# Patient Record
Sex: Female | Born: 1950
Health system: Southern US, Community
[De-identification: ages and names within clinical notes are randomized; demographics above are authoritative.]

## PROBLEM LIST (undated history)

## (undated) DIAGNOSIS — L4050 Arthropathic psoriasis, unspecified: Secondary | ICD-10-CM

## (undated) DIAGNOSIS — H8110 Benign paroxysmal vertigo, unspecified ear: Secondary | ICD-10-CM

## (undated) DIAGNOSIS — R42 Dizziness and giddiness: Secondary | ICD-10-CM

## (undated) DIAGNOSIS — Z86718 Personal history of other venous thrombosis and embolism: Secondary | ICD-10-CM

## (undated) DIAGNOSIS — K219 Gastro-esophageal reflux disease without esophagitis: Secondary | ICD-10-CM

## (undated) DIAGNOSIS — F1190 Opioid use, unspecified, uncomplicated: Secondary | ICD-10-CM

## (undated) DIAGNOSIS — Z79891 Long term (current) use of opiate analgesic: Secondary | ICD-10-CM

## (undated) DIAGNOSIS — E7800 Pure hypercholesterolemia, unspecified: Secondary | ICD-10-CM

## (undated) DIAGNOSIS — B1910 Unspecified viral hepatitis B without hepatic coma: Secondary | ICD-10-CM

## (undated) DIAGNOSIS — E119 Type 2 diabetes mellitus without complications: Secondary | ICD-10-CM

## (undated) DIAGNOSIS — B159 Hepatitis A without hepatic coma: Secondary | ICD-10-CM

## (undated) DIAGNOSIS — I10 Essential (primary) hypertension: Secondary | ICD-10-CM

## (undated) DIAGNOSIS — F329 Major depressive disorder, single episode, unspecified: Secondary | ICD-10-CM

## (undated) DIAGNOSIS — M5432 Sciatica, left side: Secondary | ICD-10-CM

## (undated) DIAGNOSIS — I499 Cardiac arrhythmia, unspecified: Secondary | ICD-10-CM

## (undated) HISTORY — PX: TUBAL LIGATION: SHX77

## (undated) HISTORY — PX: INDUCED ABORTION: SHX677

## (undated) HISTORY — PX: COLONOSCOPY: SHX174

## (undated) HISTORY — PX: DILATION AND CURETTAGE OF UTERUS: SHX78

---
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
## 2010-07-21 ENCOUNTER — Ambulatory Visit: Payer: Self-pay

## 2012-08-01 ENCOUNTER — Ambulatory Visit: Payer: Self-pay | Admitting: Internal Medicine

## 2012-11-06 ENCOUNTER — Ambulatory Visit: Payer: Self-pay | Admitting: Internal Medicine

## 2012-12-26 ENCOUNTER — Ambulatory Visit: Payer: Self-pay | Admitting: Neurosurgery

## 2013-06-24 ENCOUNTER — Ambulatory Visit: Payer: Self-pay | Admitting: Gastroenterology

## 2013-06-26 LAB — PATHOLOGY REPORT

## 2013-08-19 ENCOUNTER — Ambulatory Visit: Payer: Self-pay | Admitting: Nurse Practitioner

## 2014-06-12 DIAGNOSIS — Z86718 Personal history of other venous thrombosis and embolism: Secondary | ICD-10-CM

## 2014-06-12 HISTORY — DX: Personal history of other venous thrombosis and embolism: Z86.718

## 2014-09-17 ENCOUNTER — Ambulatory Visit
Admit: 2014-09-17 | Disposition: A | Discharge status: Home / Self Care | Payer: Self-pay | Attending: Nurse Practitioner | Admitting: Nurse Practitioner

## 2015-11-09 DIAGNOSIS — F329 Major depressive disorder, single episode, unspecified: Secondary | ICD-10-CM | POA: Insufficient documentation

## 2015-11-09 DIAGNOSIS — M545 Low back pain: Secondary | ICD-10-CM

## 2015-11-09 DIAGNOSIS — F32A Depression, unspecified: Secondary | ICD-10-CM

## 2015-11-09 DIAGNOSIS — L4050 Arthropathic psoriasis, unspecified: Secondary | ICD-10-CM | POA: Insufficient documentation

## 2015-11-09 DIAGNOSIS — L409 Psoriasis, unspecified: Secondary | ICD-10-CM | POA: Insufficient documentation

## 2015-11-09 DIAGNOSIS — G8929 Other chronic pain: Secondary | ICD-10-CM | POA: Insufficient documentation

## 2015-11-09 DIAGNOSIS — E669 Obesity, unspecified: Secondary | ICD-10-CM

## 2015-11-09 DIAGNOSIS — I10 Essential (primary) hypertension: Secondary | ICD-10-CM

## 2015-11-09 DIAGNOSIS — M7751 Other enthesopathy of right foot: Secondary | ICD-10-CM | POA: Insufficient documentation

## 2015-11-09 HISTORY — DX: Depression, unspecified: F32.A

## 2015-11-19 DIAGNOSIS — M47817 Spondylosis without myelopathy or radiculopathy, lumbosacral region: Secondary | ICD-10-CM | POA: Insufficient documentation

## 2015-11-19 DIAGNOSIS — M779 Enthesopathy, unspecified: Secondary | ICD-10-CM | POA: Insufficient documentation

## 2015-11-19 DIAGNOSIS — B180 Chronic viral hepatitis B with delta-agent: Secondary | ICD-10-CM

## 2015-11-19 DIAGNOSIS — R768 Other specified abnormal immunological findings in serum: Secondary | ICD-10-CM | POA: Insufficient documentation

## 2015-12-11 DIAGNOSIS — B1910 Unspecified viral hepatitis B without hepatic coma: Secondary | ICD-10-CM

## 2015-12-11 HISTORY — DX: Unspecified viral hepatitis B without hepatic coma: B19.10

## 2015-12-22 ENCOUNTER — Other Ambulatory Visit: Payer: Self-pay | Admitting: Student

## 2015-12-22 DIAGNOSIS — K760 Fatty (change of) liver, not elsewhere classified: Secondary | ICD-10-CM

## 2015-12-27 ENCOUNTER — Ambulatory Visit
Admission: RE | Admit: 2015-12-27 | Discharge: 2015-12-27 | Disposition: A | Discharge status: Home / Self Care | Payer: Medicare Other | Source: Ambulatory Visit | Attending: Student | Admitting: Student

## 2015-12-27 DIAGNOSIS — R768 Other specified abnormal immunological findings in serum: Secondary | ICD-10-CM | POA: Insufficient documentation

## 2015-12-27 DIAGNOSIS — Z8719 Personal history of other diseases of the digestive system: Secondary | ICD-10-CM | POA: Insufficient documentation

## 2015-12-27 DIAGNOSIS — K760 Fatty (change of) liver, not elsewhere classified: Secondary | ICD-10-CM | POA: Diagnosis not present

## 2016-04-03 ENCOUNTER — Other Ambulatory Visit: Payer: Self-pay | Admitting: Nurse Practitioner

## 2016-04-03 DIAGNOSIS — Z1231 Encounter for screening mammogram for malignant neoplasm of breast: Secondary | ICD-10-CM

## 2016-05-08 ENCOUNTER — Ambulatory Visit
Admission: RE | Admit: 2016-05-08 | Discharge: 2016-05-08 | Disposition: A | Discharge status: Home / Self Care | Payer: Medicare Other | Source: Ambulatory Visit | Attending: Nurse Practitioner | Admitting: Nurse Practitioner

## 2016-05-08 ENCOUNTER — Encounter (HOSPITAL_COMMUNITY): Payer: Self-pay

## 2016-05-08 DIAGNOSIS — Z1231 Encounter for screening mammogram for malignant neoplasm of breast: Secondary | ICD-10-CM | POA: Insufficient documentation

## 2016-08-28 DIAGNOSIS — R200 Anesthesia of skin: Secondary | ICD-10-CM

## 2016-08-28 DIAGNOSIS — G5600 Carpal tunnel syndrome, unspecified upper limb: Secondary | ICD-10-CM | POA: Insufficient documentation

## 2016-08-28 DIAGNOSIS — Z79899 Other long term (current) drug therapy: Secondary | ICD-10-CM

## 2016-08-28 DIAGNOSIS — R202 Paresthesia of skin: Secondary | ICD-10-CM

## 2016-08-29 ENCOUNTER — Other Ambulatory Visit: Payer: Self-pay

## 2016-08-29 ENCOUNTER — Telehealth: Payer: Self-pay

## 2016-08-29 DIAGNOSIS — Z86010 Personal history of colonic polyps: Secondary | ICD-10-CM

## 2016-08-29 NOTE — Telephone Encounter (Signed)
Gastroenterology Pre-Procedure Review  Request Date:  10/02/16 Requesting Physician: Dr. Allen Norris  PATIENT REVIEW QUESTIONS: The patient responded to the following health history questions as indicated:    1. Are you having any GI issues? no 2. Do you have a personal history of Polyps? yes (adenoma) 3. Do you have a family history of Colon Cancer or Polyps? no 4. Diabetes Mellitus? yes (Type II) 5. Joint replacements in the past 12 months?no 6. Major health problems in the past 3 months?no 7. Any artificial heart valves, MVP, or defibrillator?no    MEDICATIONS & ALLERGIES:    Patient reports the following regarding taking any anticoagulation/antiplatelet therapy:   Plavix, Coumadin, Eliquis, Xarelto, Lovenox, Pradaxa, Brilinta, or Effient? yes (Xarelto) Aspirin? No  Patient confirms/reports the following medications:  Current Outpatient Prescriptions  Medication Sig Dispense Refill  . amoxicillin-clavulanate (AUGMENTIN) 875-125 MG tablet     . Apremilast 30 MG TABS Take by mouth.    Marland Kitchen atorvastatin (LIPITOR) 20 MG tablet     . cyclobenzaprine (FLEXERIL) 10 MG tablet     . fenofibrate (TRICOR) 145 MG tablet Take by mouth.    . fluticasone (FLONASE) 50 MCG/ACT nasal spray Place into the nose.    . gabapentin (NEURONTIN) 300 MG capsule Take 1 tab 2 to 3 times a day as needed    . lisinopril-hydrochlorothiazide (PRINZIDE,ZESTORETIC) 10-12.5 MG tablet once a day    . metFORMIN (GLUCOPHAGE-XR) 500 MG 24 hr tablet     . pantoprazole (PROTONIX) 40 MG tablet     . rivaroxaban (XARELTO) 20 MG TABS tablet Take by mouth.    . sertraline (ZOLOFT) 100 MG tablet Take by mouth.    . traMADol (ULTRAM) 50 MG tablet 1 tablet 3 times a day for 10 days as needed for back pain    . fluocinonide cream (LIDEX) 0.05 % Apply topically.     No current facility-administered medications for this visit.     Patient confirms/reports the following allergies:  Allergies not on file  No orders of the defined  types were placed in this encounter.   AUTHORIZATION INFORMATION Primary Insurance: 1D#: Group #:  Secondary Insurance: 1D#: Group #:  SCHEDULE INFORMATION: Date: 10/02/16 Time: Location: [place]

## 2016-09-12 IMAGING — US US ABDOMEN COMPLETE
1 series · 14 of 25 positions shown · non-contrast
Comparison: None.

CLINICAL DATA: Fatty liver.  Abnormal LFTs.

EXAM:
ABDOMEN ULTRASOUND COMPLETE

[Series 1: us abdomen complete · 0.17mm/px · 14 of 94 slices shown]
[im 1/94]
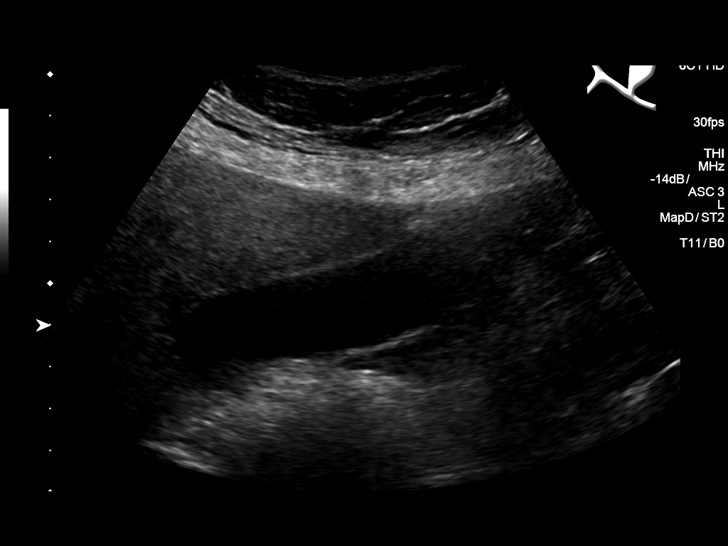
[im 8/94]
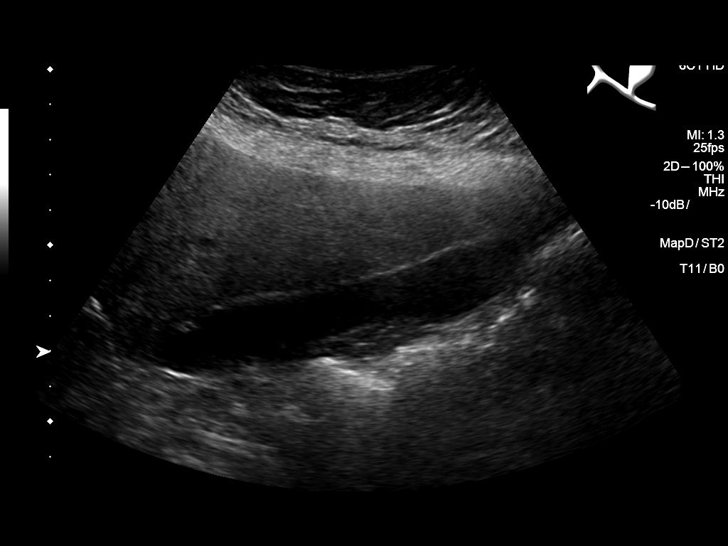
[im 16/94]
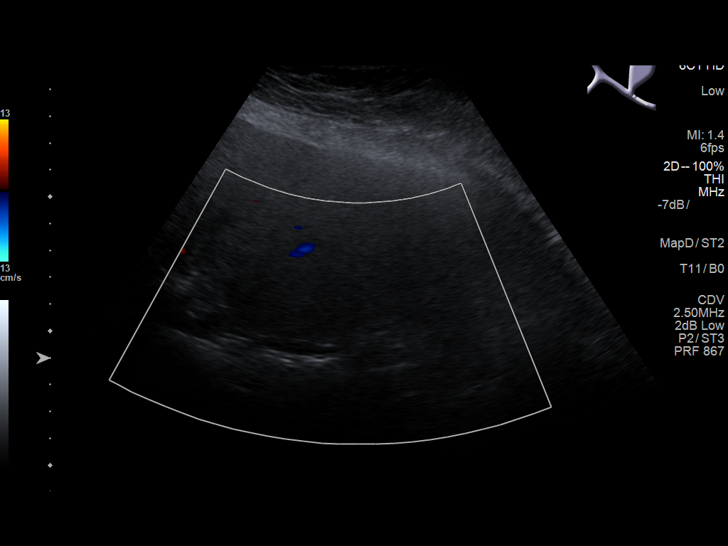
[im 24/94]
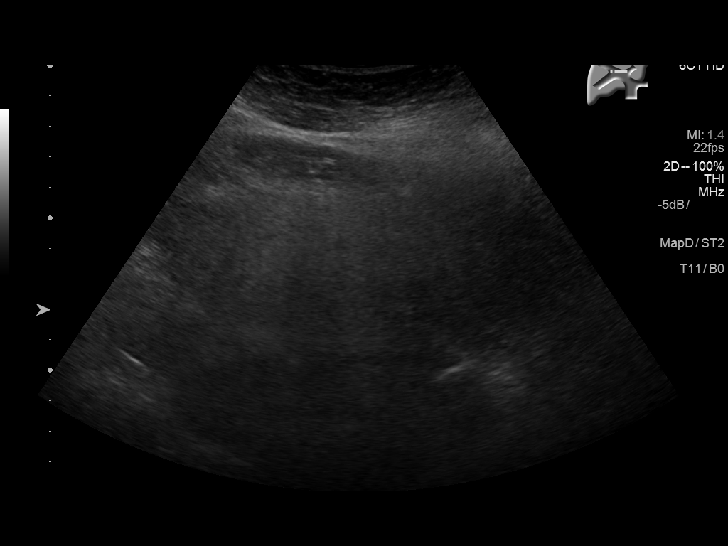
[im 32/94]
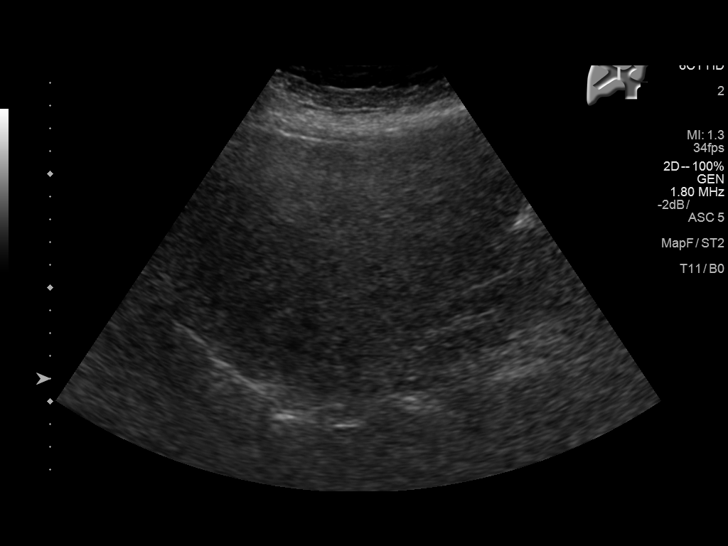
[im 35/94]
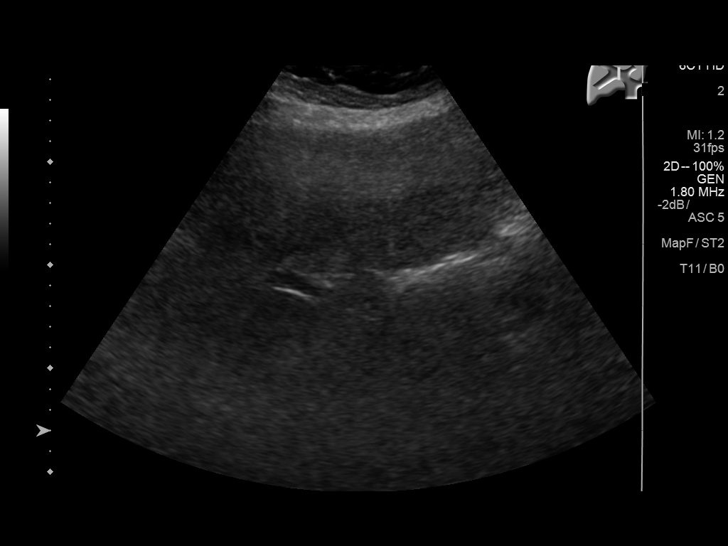
[im 43/94]
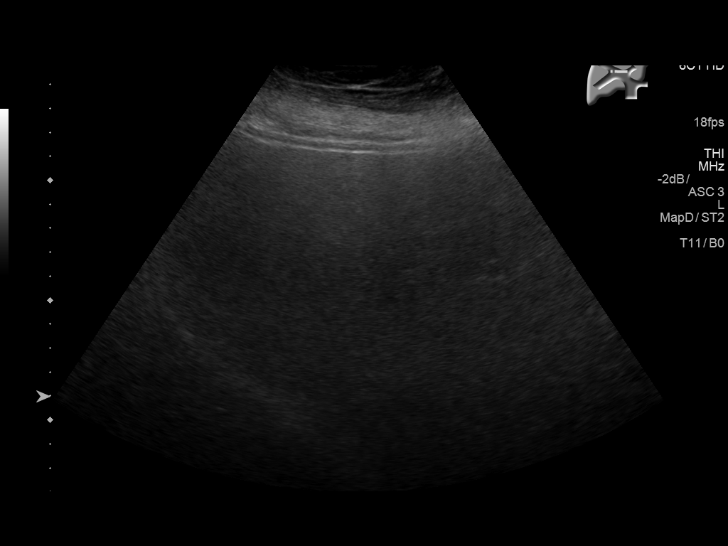
[im 51/94]
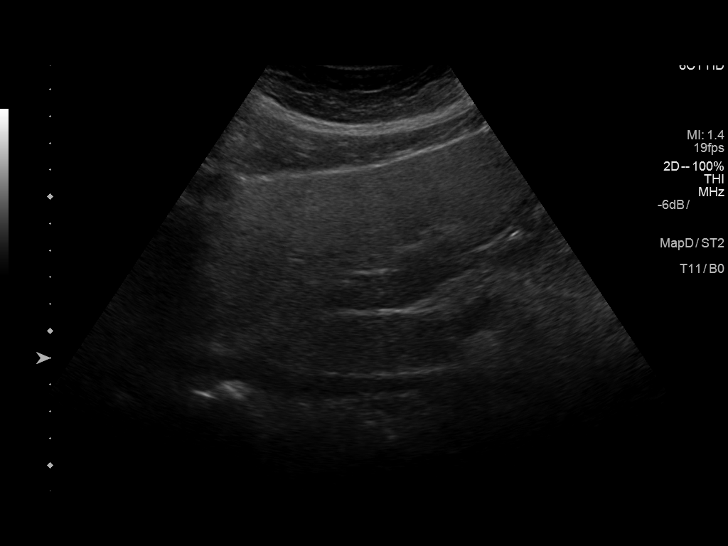
[im 59/94]
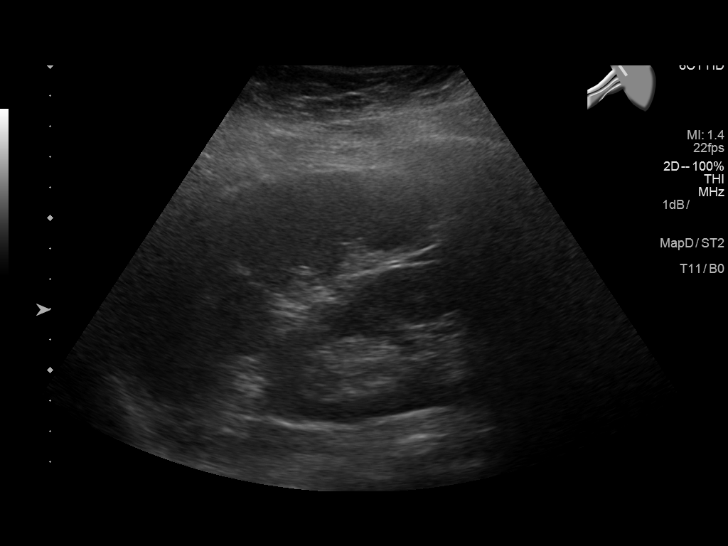
[im 63/94]
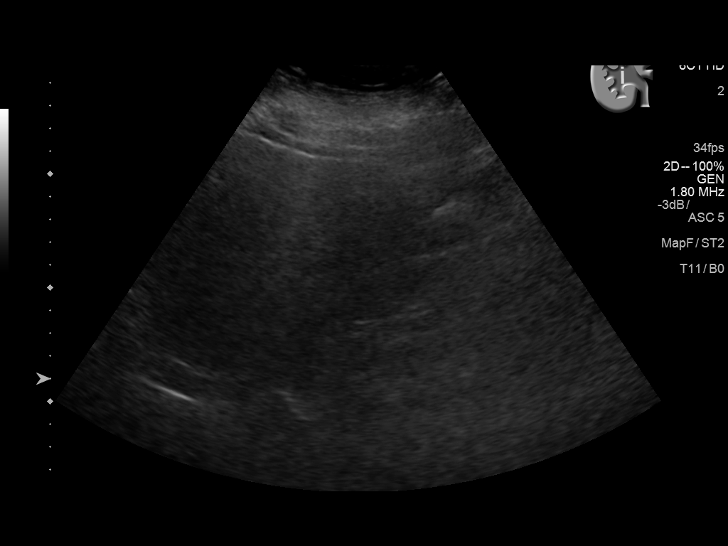
[im 70/94]
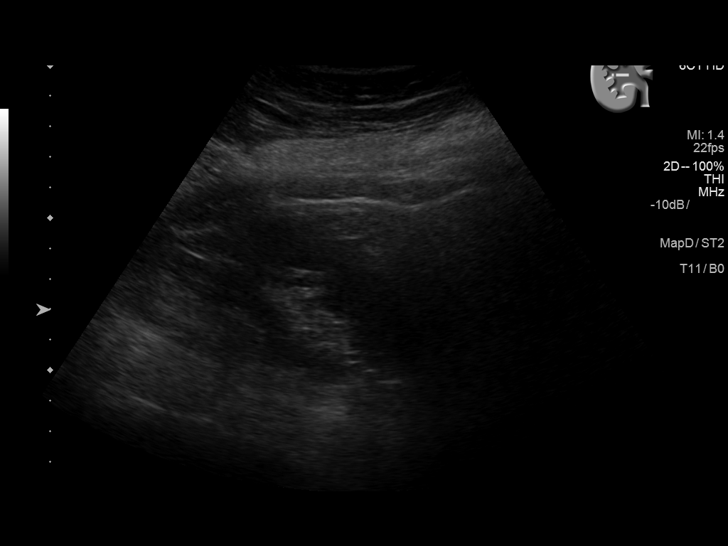
[im 78/94]
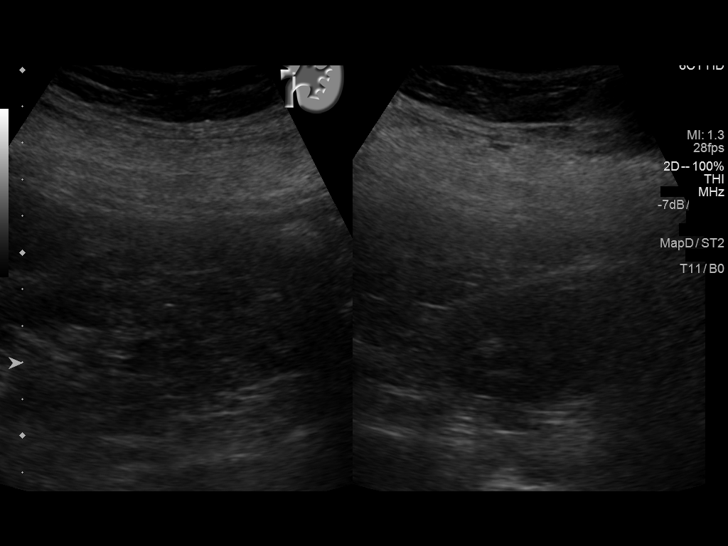
[im 86/94]
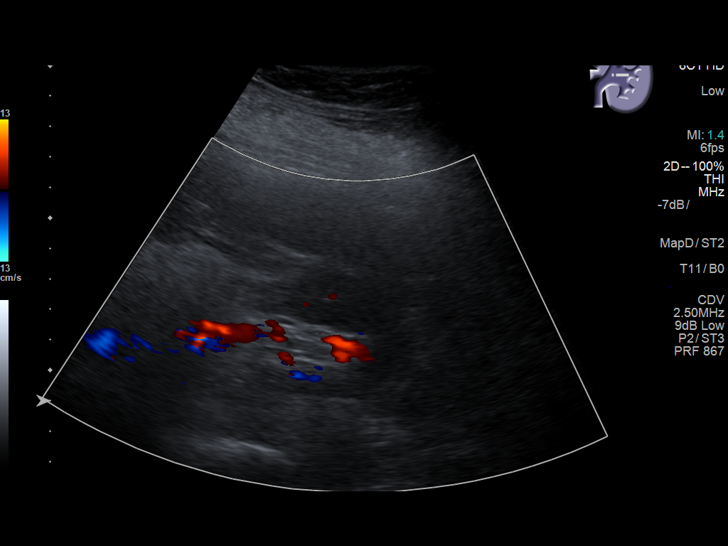
[im 94/94]
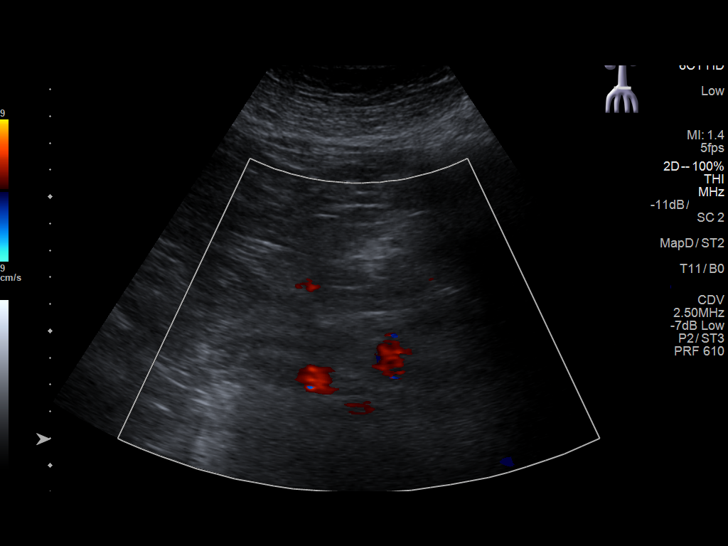

[14 of 25 positions shown; findings below may reference images not displayed]

FINDINGS: Gallbladder: No gallstones or wall thickening visualized. No
sonographic Murphy sign noted by sonographer.

Common bile duct: Diameter: 6.1 mm

Liver: No focal abnormality identified. The liver is echodense
consistent fatty infiltration and/or hepatocellular disease.

IVC: No abnormality visualized.

Pancreas: Visualized portion unremarkable.

Spleen: Size and appearance within normal limits.

Right Kidney: Length: 12.1 cm. Echogenicity within normal limits. No
mass or hydronephrosis visualized.

Left Kidney: Length: 12.2 cm. Echogenicity within normal limits. No
hydronephrosis visualized. 1.1 cm simple cyst

Abdominal aorta: No aneurysm visualized.

Other findings: None.
IMPRESSION: 1. Liver is echodense consistent fatty infiltration and/or
hepatocellular disease.

2. No acute or focal abnormality.

## 2016-10-03 ENCOUNTER — Encounter: Payer: Self-pay | Admitting: *Deleted

## 2016-10-06 NOTE — Discharge Instructions (Signed)

## 2016-10-09 ENCOUNTER — Ambulatory Visit: Payer: Medicare Other | Admitting: Anesthesiology

## 2016-10-09 ENCOUNTER — Encounter
Admission: RE | Disposition: A | Discharge status: Home / Self Care | Payer: Self-pay | Source: Ambulatory Visit | Attending: Gastroenterology

## 2016-10-09 ENCOUNTER — Ambulatory Visit
Admission: RE | Admit: 2016-10-09 | Discharge: 2016-10-09 | Disposition: A | Discharge status: Home / Self Care | Payer: Medicare Other | Source: Ambulatory Visit | Attending: Gastroenterology | Admitting: Gastroenterology

## 2016-10-09 DIAGNOSIS — Z79899 Other long term (current) drug therapy: Secondary | ICD-10-CM | POA: Diagnosis not present

## 2016-10-09 DIAGNOSIS — Z7984 Long term (current) use of oral hypoglycemic drugs: Secondary | ICD-10-CM | POA: Diagnosis not present

## 2016-10-09 DIAGNOSIS — K5730 Diverticulosis of large intestine without perforation or abscess without bleeding: Secondary | ICD-10-CM | POA: Insufficient documentation

## 2016-10-09 DIAGNOSIS — I10 Essential (primary) hypertension: Secondary | ICD-10-CM | POA: Diagnosis not present

## 2016-10-09 DIAGNOSIS — F329 Major depressive disorder, single episode, unspecified: Secondary | ICD-10-CM | POA: Diagnosis not present

## 2016-10-09 DIAGNOSIS — Z7901 Long term (current) use of anticoagulants: Secondary | ICD-10-CM | POA: Insufficient documentation

## 2016-10-09 DIAGNOSIS — Z1211 Encounter for screening for malignant neoplasm of colon: Secondary | ICD-10-CM | POA: Diagnosis present

## 2016-10-09 DIAGNOSIS — L4050 Arthropathic psoriasis, unspecified: Secondary | ICD-10-CM | POA: Diagnosis not present

## 2016-10-09 DIAGNOSIS — Z86718 Personal history of other venous thrombosis and embolism: Secondary | ICD-10-CM | POA: Insufficient documentation

## 2016-10-09 DIAGNOSIS — Z860100 Personal history of colon polyps, unspecified: Secondary | ICD-10-CM

## 2016-10-09 DIAGNOSIS — D125 Benign neoplasm of sigmoid colon: Secondary | ICD-10-CM

## 2016-10-09 DIAGNOSIS — E7800 Pure hypercholesterolemia, unspecified: Secondary | ICD-10-CM | POA: Diagnosis not present

## 2016-10-09 DIAGNOSIS — E119 Type 2 diabetes mellitus without complications: Secondary | ICD-10-CM | POA: Diagnosis not present

## 2016-10-09 DIAGNOSIS — K640 First degree hemorrhoids: Secondary | ICD-10-CM | POA: Insufficient documentation

## 2016-10-09 DIAGNOSIS — K219 Gastro-esophageal reflux disease without esophagitis: Secondary | ICD-10-CM | POA: Insufficient documentation

## 2016-10-09 DIAGNOSIS — Z91013 Allergy to seafood: Secondary | ICD-10-CM | POA: Insufficient documentation

## 2016-10-09 DIAGNOSIS — D126 Benign neoplasm of colon, unspecified: Secondary | ICD-10-CM

## 2016-10-09 DIAGNOSIS — R000 Tachycardia, unspecified: Secondary | ICD-10-CM | POA: Diagnosis not present

## 2016-10-09 DIAGNOSIS — Z87891 Personal history of nicotine dependence: Secondary | ICD-10-CM | POA: Diagnosis not present

## 2016-10-09 DIAGNOSIS — Z6841 Body Mass Index (BMI) 40.0 and over, adult: Secondary | ICD-10-CM | POA: Diagnosis not present

## 2016-10-09 DIAGNOSIS — Z885 Allergy status to narcotic agent status: Secondary | ICD-10-CM | POA: Diagnosis not present

## 2016-10-09 HISTORY — DX: Unspecified viral hepatitis B without hepatic coma: B19.10

## 2016-10-09 HISTORY — DX: Arthropathic psoriasis, unspecified: L40.50

## 2016-10-09 HISTORY — DX: Essential (primary) hypertension: I10

## 2016-10-09 HISTORY — DX: Personal history of other venous thrombosis and embolism: Z86.718

## 2016-10-09 HISTORY — DX: Sciatica, left side: M54.32

## 2016-10-09 HISTORY — DX: Cardiac arrhythmia, unspecified: I49.9

## 2016-10-09 HISTORY — DX: Dizziness and giddiness: R42

## 2016-10-09 HISTORY — DX: Pure hypercholesterolemia, unspecified: E78.00

## 2016-10-09 HISTORY — DX: Major depressive disorder, single episode, unspecified: F32.9

## 2016-10-09 HISTORY — PX: POLYPECTOMY: SHX5525

## 2016-10-09 HISTORY — DX: Hepatitis a without hepatic coma: B15.9

## 2016-10-09 HISTORY — PX: COLONOSCOPY WITH PROPOFOL: SHX5780

## 2016-10-09 HISTORY — DX: Type 2 diabetes mellitus without complications: E11.9

## 2016-10-09 HISTORY — DX: Gastro-esophageal reflux disease without esophagitis: K21.9

## 2016-10-09 LAB — GLUCOSE, CAPILLARY
GLUCOSE-CAPILLARY: 109 mg/dL — AB (ref 65–99)
GLUCOSE-CAPILLARY: 114 mg/dL — AB (ref 65–99)

## 2016-10-09 SURGERY — COLONOSCOPY WITH PROPOFOL
Anesthesia: Monitor Anesthesia Care | Wound class: Contaminated

## 2016-10-09 MED ORDER — STERILE WATER FOR IRRIGATION IR SOLN
Status: DC | PRN
  Administered 2016-10-09: 08:00:00

## 2016-10-09 MED ORDER — OXYCODONE HCL 5 MG/5ML PO SOLN: 5.0000 mg | Freq: Once | ORAL | Status: DC | PRN

## 2016-10-09 MED ORDER — FENTANYL CITRATE (PF) 100 MCG/2ML IJ SOLN: 25.0000 ug | INTRAMUSCULAR | Status: DC | PRN

## 2016-10-09 MED ORDER — LIDOCAINE HCL (CARDIAC) 20 MG/ML IV SOLN
INTRAVENOUS | Status: DC | PRN
  Administered 2016-10-09: 50 mg via INTRAVENOUS

## 2016-10-09 MED ORDER — LACTATED RINGERS IV SOLN
INTRAVENOUS | Status: DC | PRN
  Administered 2016-10-09: 08:00:00 via INTRAVENOUS

## 2016-10-09 MED ORDER — OXYCODONE HCL 5 MG PO TABS: 5.0000 mg | ORAL_TABLET | Freq: Once | ORAL | Status: DC | PRN

## 2016-10-09 MED ORDER — PROPOFOL 10 MG/ML IV BOLUS
INTRAVENOUS | Status: DC | PRN
  Administered 2016-10-09 (×2): 50 mg via INTRAVENOUS
  Administered 2016-10-09: 100 mg via INTRAVENOUS
  Administered 2016-10-09 (×2): 50 mg via INTRAVENOUS

## 2016-10-09 SURGICAL SUPPLY — 23 items

## 2016-10-09 NOTE — Anesthesia Procedure Notes (Signed)
Procedure Name: MAC Date/Time: 10/09/2016 7:48 AM Performed by: Janna Arch Pre-anesthesia Checklist: Patient identified, Emergency Drugs available, Suction available and Patient being monitored Patient Re-evaluated:Patient Re-evaluated prior to inductionOxygen Delivery Method: Nasal cannula

## 2016-10-09 NOTE — Anesthesia Postprocedure Evaluation (Signed)
Anesthesia Post Note  Patient: Diane Morris  Procedure(s) Performed: Procedure(s) (LRB): COLONOSCOPY WITH PROPOFOL (N/A) POLYPECTOMY  Patient location during evaluation: PACU Anesthesia Type: MAC Level of consciousness: awake Pain management: pain level controlled Vital Signs Assessment: post-procedure vital signs reviewed and stable Respiratory status: spontaneous breathing Cardiovascular status: blood pressure returned to baseline Postop Assessment: no headache Anesthetic complications: no    Lavonna Monarch

## 2016-10-09 NOTE — Transfer of Care (Signed)
Immediate Anesthesia Transfer of Care Note  Patient: Diane Morris  Procedure(s) Performed: Procedure(s) with comments: COLONOSCOPY WITH PROPOFOL (N/A) - Diabetic - oral meds POLYPECTOMY  Patient Location: PACU  Anesthesia Type: MAC  Level of Consciousness: awake, alert  and patient cooperative  Airway and Oxygen Therapy: Patient Spontanous Breathing and Patient connected to supplemental oxygen  Post-op Assessment: Post-op Vital signs reviewed, Patient's Cardiovascular Status Stable, Respiratory Function Stable, Patent Airway and No signs of Nausea or vomiting  Post-op Vital Signs: Reviewed and stable  Complications: No apparent anesthesia complications

## 2016-10-09 NOTE — Op Note (Signed)
[facility] Patient Name: Diane Morris Procedure Date: 10/09/2016 7:40 AM MRN: [id number] Account #: [id number] Date of Birth: 1951-05-08 Admit Type: Outpatient Age: 66 Room: [facility] OR ROOM 01 Gender: Female Note Status: Finalized Procedure:            Colonoscopy Indications:          High risk colon cancer surveillance: Personal history                        of colonic polyps Providers:            [name] MD, MD Referring MD:         Perrin Maltese, MD (Referring MD) Medicines:            Propofol per Anesthesia Complications:        No immediate complications. Procedure:            Pre-Anesthesia Assessment:                       - Prior to the procedure, a History and Physical was                        performed, and patient medications and allergies were                        reviewed. The patient's tolerance of previous                        anesthesia was also reviewed. The risks and benefits of                        the procedure and the sedation options and risks were                        discussed with the patient. All questions were                        answered, and informed consent was obtained. Prior                        Anticoagulants: The patient has taken no previous                        anticoagulant or antiplatelet agents. ASA Grade                        Assessment: II - A patient with mild systemic disease.                        After reviewing the risks and benefits, the patient was                        deemed in satisfactory condition to undergo the                        procedure.                       After obtaining informed consent, the colonoscope was  passed under direct vision. Throughout the procedure,                        the patient's blood pressure, pulse, and oxygen                        saturations were monitored continuously. The [place] [phone]) was introduced through the                        anus and advanced to the the cecum, identified by                        appendiceal orifice and ileocecal valve. The                        colonoscopy was performed without difficulty. The                        patient tolerated the procedure well. The quality of                        the bowel preparation was excellent. Findings:      The perianal and digital rectal examinations were normal.      A 3 mm polyp was found in the sigmoid colon. The polyp was sessile. The       polyp was removed with a cold biopsy forceps. Resection and retrieval       were complete.      A single small-mouthed diverticulum was found in the sigmoid colon.      Non-bleeding internal hemorrhoids were found during retroflexion. The       hemorrhoids were Grade I (internal hemorrhoids that do not prolapse). Impression:           - One 3 mm polyp in the sigmoid colon, removed with a                        cold biopsy forceps. Resected and retrieved.                       - Diverticulosis in the sigmoid colon.                       - Non-bleeding internal hemorrhoids. Recommendation:       - Discharge patient to home.                       - Resume previous diet.                       - Continue present medications. Procedure Code(s):    --- Professional ---                       [phone], Colonoscopy, flexible; with biopsy, single or                        multiple Diagnosis Code(s):    --- Professional ---  Z86.010, Personal history of colonic polyps                       D12.5, Benign neoplasm of sigmoid colon CPT copyright 2016 American [employer] Association. All rights reserved. The codes documented in this report are preliminary and upon coder review may  be revised to meet current compliance requirements. [name] MD, MD 10/09/2016 8:12:21 AM This report has been signed electronically. Number of Addenda:  0 Note Initiated On: 10/09/2016 7:40 AM Scope Withdrawal Time: 0 hours 7 minutes 55 seconds  Total Procedure Duration: 0 hours 11 minutes 8 seconds       [facility]

## 2016-10-09 NOTE — H&P (Signed)
[name], MD [address] [place], [place] [address] Phone:[phone] Fax : [phone]  Primary Care Physician:  Perrin Maltese, MD Primary Gastroenterologist:  Dr. Allen Norris  Pre-Procedure History & Physical: HPI:  Diane Morris is a 65 y.o. female is here for an colonoscopy.   Past Medical History:  Diagnosis Date  . Depression   . Diabetes mellitus type 2, noninsulin dependent ([place])   . Dysrhythmia    tachycardia - controlled with metoprolol  . GERD (gastroesophageal reflux disease)   . Hepatitis A    as teenager. treated  . Hepatitis B 12/2015   positive test  . Hx of blood clots 2016   left calf  . Hypercholesteremia   . Hypertension   . Psoriatic arthritis ([place])   . Sciatica of left side   . Vertigo    when anxious    Past Surgical History:  Procedure Laterality Date  . COLONOSCOPY    . DILATION AND CURETTAGE OF UTERUS    . INDUCED ABORTION    . TUBAL LIGATION      Prior to Admission medications   Medication Sig Start Date End Date Taking? Authorizing Provider  albuterol (PROVENTIL HFA;VENTOLIN HFA) 108 (90 Base) MCG/ACT inhaler Inhale into the lungs every 6 (six) hours as needed for wheezing or shortness of breath.   Yes Historical Provider, MD  Apremilast 30 MG TABS Take by mouth. 03/29/16  Yes Historical Provider, MD  atorvastatin (LIPITOR) 20 MG tablet  03/14/16  Yes Historical Provider, MD  cyclobenzaprine (FLEXERIL) 10 MG tablet  08/15/15  Yes Historical Provider, MD  gabapentin (NEURONTIN) 300 MG capsule Take 1 tab 2 to 3 times a day as needed 11/19/15  Yes Historical Provider, MD  lisinopril-hydrochlorothiazide (PRINZIDE,ZESTORETIC) 10-12.5 MG tablet once a day 02/10/15  Yes Historical Provider, MD  Loratadine 10 MG CAPS Take by mouth daily.   Yes Historical Provider, MD  metFORMIN (GLUCOPHAGE-XR) 500 MG 24 hr tablet  08/15/15  Yes Historical Provider, MD  metoprolol succinate (TOPROL-XL) 100 MG 24 hr tablet Take 100 mg by mouth daily.  Take with or immediately following a meal.   Yes Historical Provider, MD  pantoprazole (PROTONIX) 40 MG tablet  08/15/15  Yes Historical Provider, MD  rivaroxaban (XARELTO) 20 MG TABS tablet Take by mouth. 08/17/15  Yes Historical Provider, MD  sertraline (ZOLOFT) 100 MG tablet Take by mouth. 08/15/15  Yes Historical Provider, MD  traMADol (ULTRAM) 50 MG tablet 1 tablet 3 times a day for 10 days as needed for back pain 12/30/15  Yes Historical Provider, MD  fluocinonide cream (LIDEX) 0.05 % Apply topically.    Historical Provider, MD    Allergies as of 08/29/2016 - never reviewed  Allergen Reaction Noted  . Codeine  09/19/2012  . Other Other (See Comments) 12/22/2015  . Shellfish allergy Swelling 09/19/2012    Family History  Problem Relation Age of Onset  . Breast cancer Neg Hx     Social History   Social History  . Marital status: Widowed    Spouse name: N/A  . Number of children: N/A  . Years of education: N/A   Occupational History  . Not on file.   Social History Main Topics  . Smoking status: Former Smoker    Quit date: 06/12/2006  . Smokeless tobacco: Never Used  . Alcohol use No  . Drug use: Unknown  . Sexual activity: Not on file   Other Topics Concern  . Not on file   Social  History Narrative  . No narrative on file    Review of Systems: See HPI, otherwise negative ROS  Physical Exam: BP 101/68   Pulse 87   Temp 97.5 F (36.4 C) (Oral)   Resp 18   Ht 5\' 7"  (1.702 m)   Wt 256 lb (116.1 kg)   SpO2 99%   BMI 40.10 kg/m  General:   Alert,  pleasant and cooperative in NAD Head:  Normocephalic and atraumatic. Neck:  Supple; no masses or thyromegaly. Lungs:  Clear throughout to auscultation.    Heart:  Regular rate and rhythm. Abdomen:  Soft, nontender and nondistended. Normal bowel sounds, without guarding, and without rebound.   Neurologic:  Alert and  oriented x4;  grossly normal neurologically.  Impression/Plan: Diane Morris is here for an  colonoscopy to be performed for history of colon polyps  Risks, benefits, limitations, and alternatives regarding  colonoscopy have been reviewed with the patient.  Questions have been answered.  All parties agreeable.   [name], MD  10/09/2016, 7:27 AM

## 2016-10-09 NOTE — Anesthesia Preprocedure Evaluation (Signed)
Anesthesia Evaluation  Patient identified by MRN, date of birth, ID band Patient awake    Airway Mallampati: I  TM Distance: >3 FB Neck ROM: Full    Dental no notable dental hx.    Pulmonary neg pulmonary ROS, former smoker,    Pulmonary exam normal breath sounds clear to auscultation       Cardiovascular hypertension, Normal cardiovascular exam+ dysrhythmias (Sinus tachycardia, on metop)  Rhythm:Regular Rate:Normal     Neuro/Psych Depression  Neuromuscular disease    GI/Hepatic Neg liver ROS, GERD  ,  Endo/Other  diabetes, Type 2Morbid obesity  Renal/GU negative Renal ROS  negative genitourinary   Musculoskeletal Psoriatic arthritis   Abdominal Normal abdominal exam  (+) + obese,  Abdomen: soft.    Peds  Hematology negative hematology ROS (+)   Anesthesia Other Findings   Reproductive/Obstetrics negative OB ROS                             Anesthesia Physical Anesthesia Plan  ASA: II  Anesthesia Plan: MAC   Post-op Pain Management:    Induction: Intravenous  Airway Management Planned: Mask  Additional Equipment: None  Intra-op Plan:   Post-operative Plan:   Informed Consent: I have reviewed the patients History and Physical, chart, labs and discussed the procedure including the risks, benefits and alternatives for the proposed anesthesia with the patient or authorized representative who has indicated his/her understanding and acceptance.     Plan Discussed with:   Anesthesia Plan Comments:         Anesthesia Quick Evaluation

## 2016-10-10 ENCOUNTER — Encounter: Payer: Self-pay | Admitting: Gastroenterology

## 2016-10-11 ENCOUNTER — Encounter: Payer: Self-pay | Admitting: Gastroenterology

## 2016-10-12 ENCOUNTER — Encounter: Payer: Self-pay | Admitting: Gastroenterology

## 2016-12-28 DIAGNOSIS — L0231 Cutaneous abscess of buttock: Secondary | ICD-10-CM | POA: Insufficient documentation

## 2017-01-16 ENCOUNTER — Ambulatory Visit: Payer: Medicare Other | Admitting: Nurse Practitioner

## 2017-01-18 ENCOUNTER — Encounter: Payer: Self-pay | Admitting: Gastroenterology

## 2017-02-14 ENCOUNTER — Encounter: Payer: Self-pay | Admitting: Nurse Practitioner

## 2017-02-14 ENCOUNTER — Ambulatory Visit: Payer: Medicare Other | Attending: Nurse Practitioner | Admitting: Nurse Practitioner

## 2017-02-14 VITALS — BP 113/52 | HR 93 | Temp 98.0°F | Resp 16 | Ht 67.0 in | Wt 260.0 lb

## 2017-02-14 DIAGNOSIS — Z9889 Other specified postprocedural states: Secondary | ICD-10-CM | POA: Diagnosis not present

## 2017-02-14 DIAGNOSIS — M542 Cervicalgia: Secondary | ICD-10-CM

## 2017-02-14 DIAGNOSIS — F1190 Opioid use, unspecified, uncomplicated: Secondary | ICD-10-CM

## 2017-02-14 DIAGNOSIS — D125 Benign neoplasm of sigmoid colon: Secondary | ICD-10-CM | POA: Diagnosis not present

## 2017-02-14 DIAGNOSIS — M79641 Pain in right hand: Secondary | ICD-10-CM | POA: Insufficient documentation

## 2017-02-14 DIAGNOSIS — M25511 Pain in right shoulder: Secondary | ICD-10-CM | POA: Diagnosis not present

## 2017-02-14 DIAGNOSIS — M25551 Pain in right hip: Secondary | ICD-10-CM | POA: Diagnosis not present

## 2017-02-14 DIAGNOSIS — M79642 Pain in left hand: Secondary | ICD-10-CM | POA: Diagnosis not present

## 2017-02-14 DIAGNOSIS — Z86010 Personal history of colonic polyps: Secondary | ICD-10-CM | POA: Diagnosis not present

## 2017-02-14 DIAGNOSIS — K219 Gastro-esophageal reflux disease without esophagitis: Secondary | ICD-10-CM | POA: Diagnosis not present

## 2017-02-14 DIAGNOSIS — M533 Sacrococcygeal disorders, not elsewhere classified: Secondary | ICD-10-CM

## 2017-02-14 DIAGNOSIS — Z91030 Bee allergy status: Secondary | ICD-10-CM | POA: Insufficient documentation

## 2017-02-14 DIAGNOSIS — Z885 Allergy status to narcotic agent status: Secondary | ICD-10-CM | POA: Insufficient documentation

## 2017-02-14 DIAGNOSIS — M79603 Pain in arm, unspecified: Secondary | ICD-10-CM

## 2017-02-14 DIAGNOSIS — Z91013 Allergy to seafood: Secondary | ICD-10-CM | POA: Diagnosis not present

## 2017-02-14 DIAGNOSIS — M79602 Pain in left arm: Secondary | ICD-10-CM | POA: Diagnosis not present

## 2017-02-14 DIAGNOSIS — E7800 Pure hypercholesterolemia, unspecified: Secondary | ICD-10-CM | POA: Insufficient documentation

## 2017-02-14 DIAGNOSIS — M25559 Pain in unspecified hip: Secondary | ICD-10-CM

## 2017-02-14 DIAGNOSIS — M79601 Pain in right arm: Secondary | ICD-10-CM | POA: Insufficient documentation

## 2017-02-14 DIAGNOSIS — M545 Low back pain, unspecified: Secondary | ICD-10-CM | POA: Insufficient documentation

## 2017-02-14 DIAGNOSIS — M25562 Pain in left knee: Secondary | ICD-10-CM | POA: Diagnosis not present

## 2017-02-14 DIAGNOSIS — Z79899 Other long term (current) drug therapy: Secondary | ICD-10-CM | POA: Diagnosis not present

## 2017-02-14 DIAGNOSIS — E1122 Type 2 diabetes mellitus with diabetic chronic kidney disease: Secondary | ICD-10-CM | POA: Diagnosis not present

## 2017-02-14 DIAGNOSIS — M25552 Pain in left hip: Secondary | ICD-10-CM | POA: Diagnosis not present

## 2017-02-14 DIAGNOSIS — M25561 Pain in right knee: Secondary | ICD-10-CM

## 2017-02-14 DIAGNOSIS — G8929 Other chronic pain: Secondary | ICD-10-CM | POA: Diagnosis not present

## 2017-02-14 DIAGNOSIS — Z79891 Long term (current) use of opiate analgesic: Secondary | ICD-10-CM | POA: Insufficient documentation

## 2017-02-14 DIAGNOSIS — Z1321 Encounter for screening for nutritional disorder: Secondary | ICD-10-CM

## 2017-02-14 DIAGNOSIS — Z91041 Radiographic dye allergy status: Secondary | ICD-10-CM | POA: Diagnosis not present

## 2017-02-14 DIAGNOSIS — Z87891 Personal history of nicotine dependence: Secondary | ICD-10-CM | POA: Diagnosis not present

## 2017-02-14 DIAGNOSIS — I129 Hypertensive chronic kidney disease with stage 1 through stage 4 chronic kidney disease, or unspecified chronic kidney disease: Secondary | ICD-10-CM | POA: Diagnosis not present

## 2017-02-14 DIAGNOSIS — Z86718 Personal history of other venous thrombosis and embolism: Secondary | ICD-10-CM | POA: Diagnosis not present

## 2017-02-14 DIAGNOSIS — F329 Major depressive disorder, single episode, unspecified: Secondary | ICD-10-CM | POA: Diagnosis not present

## 2017-02-14 DIAGNOSIS — Z7901 Long term (current) use of anticoagulants: Secondary | ICD-10-CM | POA: Diagnosis not present

## 2017-02-14 HISTORY — DX: Opioid use, unspecified, uncomplicated: F11.90

## 2017-02-14 HISTORY — DX: Long term (current) use of opiate analgesic: Z79.891

## 2017-02-14 NOTE — Patient Instructions (Addendum)
____________________________________________________________________________________________  Appointment Policy Summary  It is our goal and responsibility to provide the medical community with assistance in the evaluation and management of patients with chronic pain. Unfortunately our resources are limited. Because we do not have an unlimited amount of time, or available appointments, we are required to closely monitor and manage their use. The following rules exist to maximize their use:  Patient's responsibilities: 1. Punctuality:  At what time should I arrive? You should be physically present in our office 30 minutes before your scheduled appointment. Your scheduled appointment is with your assigned healthcare provider. However, it takes 5-10 minutes to be "checked-in", and another 15 minutes for the nurses to do the admission. If you arrive to our office at the time you were given for your appointment, you will end up being at least 20-25 minutes late to your appointment with the provider. 2. Tardiness:  What happens if I arrive only a few minutes after my scheduled appointment time? You will need to reschedule your appointment. The cutoff is your appointment time. This is why it is so important that you arrive at least 30 minutes before that appointment. If you have an appointment scheduled for 10:00 AM and you arrive at 10:01, you will be required to reschedule your appointment.  3. Plan ahead:  Always assume that you will encounter traffic on your way in. Plan for it. If you are dependent on a driver, make sure they understand these rules and the need to arrive early. 4. Other appointments and responsibilities:  Avoid scheduling any other appointments before or after your pain clinic appointments.  5. Be prepared:  Write down everything that you need to discuss with your healthcare provider and give this information to the admitting nurse. Write down the medications that you will need  refilled. Bring your pills and bottles (even the empty ones), to all of your appointments, except for those where a procedure is scheduled. 6. No children or pets:  Find someone to take care of them. It is not appropriate to bring them in. 7. Scheduling changes:  We request "advanced notification" of any changes or cancellations. 8. Advanced notification:  Defined as a time period of more than 24 hours prior to the originally scheduled appointment. This allows for the appointment to be offered to other patients. 9. Rescheduling:  When a visit is rescheduled, it will require the cancellation of the original appointment. For this reason they both fall within the category of "Cancellations".  10. Cancellations:  They require advanced notification. Any cancellation less than 24 hours before the  appointment will be recorded as a "No Show". 11. No Show:  Defined as an unkept appointment where the patient failed to notify or declare to the practice their intention or inability to keep the appointment.  Corrective process for repeat offenders:  1. Tardiness: Three (3) episodes of rescheduling due to late arrivals will be recorded as one (1) "No Show". 2. Cancellation or reschedule: Three (3) cancellations or rescheduling will be recorded as one (1) "No Show". 3. "No Shows": Three (3) "No Shows" within a 12 month period will result in discharge from the practice.  ____________________________________________________________________________________________  ____________________________________________________________________________________________  Pain Scale  Introduction: The pain score used by this practice is the Verbal Numerical Rating Scale (VNRS-11). This is an 11-point scale. It is for adults and children 10 years or older. There are significant differences in how the pain score is reported, used, and applied. Forget everything you learned in the past and  learn this scoring  system.  General Information: The scale should reflect your current level of pain. Unless you are specifically asked for the level of your worst pain, or your average pain. If you are asked for one of these two, then it should be understood that it is over the past 24 hours.  Basic Activities of Daily Living (ADL): Personal hygiene, dressing, eating, transferring, and using restroom.  Instructions: Most patients tend to report their level of pain as a combination of two factors, their physical pain and their psychosocial pain. This last one is also known as "suffering" and it is reflection of how physical pain affects you socially and psychologically. From now on, report them separately. From this point on, when asked to report your pain level, report only your physical pain. Use the following table for reference.  Pain Clinic Pain Levels (0-5/10)  Pain Level Score  Description  No Pain 0   Mild pain 1 Nagging, annoying, but does not interfere with basic activities of daily living (ADL). Patients are able to eat, bathe, get dressed, toileting (being able to get on and off the toilet and perform personal hygiene functions), transfer (move in and out of bed or a chair without assistance), and maintain continence (able to control bladder and bowel functions). Blood pressure and heart rate are unaffected. A normal heart rate for a healthy adult ranges from 60 to 100 bpm (beats per minute).   Mild to moderate pain 2 Noticeable and distracting. Impossible to hide from other people. More frequent flare-ups. Still possible to adapt and function close to normal. It can be very annoying and may have occasional stronger flare-ups. With discipline, patients may get used to it and adapt.   Moderate pain 3 Interferes significantly with activities of daily living (ADL). It becomes difficult to feed, bathe, get dressed, get on and off the toilet or to perform personal hygiene functions. Difficult to get in and out of  bed or a chair without assistance. Very distracting. With effort, it can be ignored when deeply involved in activities.   Moderately severe pain 4 Impossible to ignore for more than a few minutes. With effort, patients may still be able to manage work or participate in some social activities. Very difficult to concentrate. Signs of autonomic nervous system discharge are evident: dilated pupils (mydriasis); mild sweating (diaphoresis); sleep interference. Heart rate becomes elevated (>115 bpm). Diastolic blood pressure (lower number) rises above 100 mmHg. Patients find relief in laying down and not moving.   Severe pain 5 Intense and extremely unpleasant. Associated with frowning face and frequent crying. Pain overwhelms the senses.  Ability to do any activity or maintain social relationships becomes significantly limited. Conversation becomes difficult. Pacing back and forth is common, as getting into a comfortable position is nearly impossible. Pain wakes you up from deep sleep. Physical signs will be obvious: pupillary dilation; increased sweating; goosebumps; brisk reflexes; cold, clammy hands and feet; nausea, vomiting or dry heaves; loss of appetite; significant sleep disturbance with inability to fall asleep or to remain asleep. When persistent, significant weight loss is observed due to the complete loss of appetite and sleep deprivation.  Blood pressure and heart rate becomes significantly elevated. Caution: If elevated blood pressure triggers a pounding headache associated with blurred vision, then the patient should immediately seek attention at an urgent or emergency care unit, as these may be signs of an impending stroke.    Emergency Department Pain Levels (6-10/10)  Emergency Room Pain 6   Severely limiting. Requires emergency care and should not be seen or managed at an outpatient pain management facility. Communication becomes difficult and requires great effort. Assistance to reach the  emergency department may be required. Facial flushing and profuse sweating along with potentially dangerous increases in heart rate and blood pressure will be evident.   Distressing pain 7 Self-care is very difficult. Assistance is required to transport, or use restroom. Assistance to reach the emergency department will be required. Tasks requiring coordination, such as bathing and getting dressed become very difficult.   Disabling pain 8 Self-care is no longer possible. At this level, pain is disabling. The individual is unable to do even the most "basic" activities such as walking, eating, bathing, dressing, transferring to a bed, or toileting. Fine motor skills are lost. It is difficult to think clearly.   Incapacitating pain 9 Pain becomes incapacitating. Thought processing is no longer possible. Difficult to remember your own name. Control of movement and coordination are lost.   The worst pain imaginable 10 At this level, most patients pass out from pain. When this level is reached, collapse of the autonomic nervous system occurs, leading to a sudden drop in blood pressure and heart rate. This in turn results in a temporary and dramatic drop in blood flow to the brain, leading to a loss of consciousness. Fainting is one of the body's self defense mechanisms. Passing out puts the brain in a calmed state and causes it to shut down for a while, in order to begin the healing process.    Summary: 1. Refer to this scale when providing [place] with your pain level. 2. Be accurate and careful when reporting your pain level. This will help with your care. 3. Over-reporting your pain level will lead to loss of credibility. 4. Even a level of 1/10 means that there is pain and will be treated at our facility. 5. High, inaccurate reporting will be documented as "Symptom Exaggeration", leading to loss of credibility and suspicions of possible secondary gains such as obtaining more narcotics, or wanting to appear  disabled, for fraudulent reasons. 6. Only pain levels of 5 or below will be seen at our facility. 7. Pain levels of 6 and above will be sent to the Emergency Department and the appointment cancelled. ____________________________________________________________________________________________  BMI Assessment: Estimated body mass index is 40.72 kg/m as calculated from the following:   Height as of this encounter: 5\' 7"  (1.702 m).   Weight as of this encounter: 260 lb (117.9 kg).  BMI interpretation table: BMI level Category Range association with higher incidence of chronic pain  <18 kg/m2 Underweight   18.5-24.9 kg/m2 Ideal body weight   25-29.9 kg/m2 Overweight Increased incidence by 20%  30-34.9 kg/m2 Obese (Class I) Increased incidence by 68%  35-39.9 kg/m2 Severe obesity (Class II) Increased incidence by 136%  >40 kg/m2 Extreme obesity (Class III) Increased incidence by 254%   BMI Readings from Last 4 Encounters:  02/14/17 40.72 kg/m  10/09/16 40.10 kg/m   Wt Readings from Last 4 Encounters:  02/14/17 260 lb (117.9 kg)  10/09/16 256 lb (116.1 kg)

## 2017-02-14 NOTE — Progress Notes (Signed)
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 eval.  No future appointments.  Primary Care Physician: Perrin Maltese, MD Location: [facility] Note by:  Date: 02/14/2017; Time: 3:24 PM  Pain Score Disclaimer: We use the NRS-11 scale. This is a self-reported, subjective measurement of pain severity with only modest accuracy. It is used primarily to identify changes within a particular patient. It must be understood that outpatient pain scales are significantly less accurate that those used for research, where they can be applied under ideal controlled circumstances with minimal exposure to variables. In reality, the score is likely to be a combination of pain intensity and pain affect, where pain affect describes the degree of emotional arousal or changes in action readiness caused by the sensory experience of pain. Factors such as social and work situation, setting, emotional state, anxiety levels, expectation, and prior pain experience may influence pain perception and show large inter-individual differences that may also  be affected by time variables.  Patient instructions provided during this appointment: Patient Instructions    ____________________________________________________________________________________________  Appointment Policy Summary  It is our goal and responsibility to provide the medical community with assistance in the evaluation and management of patients with chronic pain. Unfortunately our resources are limited. Because we do not have an unlimited amount of time, or available appointments, we are required to closely monitor and manage their use. The following rules exist to maximize their use:  Patient's responsibilities: 1. Punctuality:  At what time should I arrive? You should be physically present in our  office 30 minutes before your scheduled appointment. Your scheduled appointment is with your assigned healthcare provider. However, it takes 5-10 minutes to be "checked-in", and another 15 minutes for the nurses to do the admission. If you arrive to our office at the time you were given for your appointment, you will end up being at least 20-25 minutes late to your appointment with the provider. 2. Tardiness:  What happens if I arrive only a few minutes after my scheduled appointment time? You will need to reschedule your appointment. The cutoff is your appointment time. This is why it is so important that you arrive at least 30 minutes before that appointment. If you have an appointment scheduled for 10:00 AM and you arrive at 10:01, you will be required to reschedule your appointment.  3. Plan ahead:  Always assume that you will encounter traffic on your way in. Plan for it. If you are dependent on a driver, make sure they understand these rules and the need to arrive early. 4. Other appointments and responsibilities:  Avoid scheduling any other appointments before or after your pain clinic appointments.  5. Be prepared:  Write down everything that you need to discuss with your  healthcare provider and give this information to the admitting nurse. Write down the medications that you will need refilled. Bring your pills and bottles (even the empty ones), to all of your appointments, except for those where a procedure is scheduled. 6. No children or pets:  Find someone to take care of them. It is not appropriate to bring them in. 7. Scheduling changes:  We request "advanced notification" of any changes or cancellations. 8. Advanced notification:  Defined as a time period of more than 24 hours prior to the originally scheduled appointment. This allows for the appointment to be offered to other patients. 9. Rescheduling:  When a visit is rescheduled, it will require the cancellation of the original appointment. For this reason they both fall within the category of "Cancellations".  10. Cancellations:  They require advanced notification. Any cancellation less than 24 hours before the  appointment will be recorded as a "No Show". 11. No Show:  Defined as an unkept appointment where the patient failed to notify or declare to the practice their intention or inability to keep the appointment.  Corrective process for repeat offenders:  1. Tardiness: Three (3) episodes of rescheduling due to late arrivals will be recorded as one (1) "No Show". 2. Cancellation or reschedule: Three (3) cancellations or rescheduling will be recorded as one (1) "No Show". 3. "No Shows": Three (3) "No Shows" within a 12 month period will result in discharge from the practice.  ____________________________________________________________________________________________  ____________________________________________________________________________________________  Pain Scale  Introduction: The pain score used by this practice is the Verbal Numerical Rating Scale (VNRS-11). This is an 11-point scale. It is for adults and children 10 years or older. There are significant differences in how the pain  score is reported, used, and applied. Forget everything you learned in the past and learn this scoring system.  General Information: The scale should reflect your current level of pain. Unless you are specifically asked for the level of your worst pain, or your average pain. If you are asked for one of these two, then it should be understood that it is over the past 24 hours.  Basic Activities of Daily Living (ADL): Personal hygiene, dressing, eating, transferring, and using restroom.  Instructions: Most patients tend to report their level of pain as a combination  of two factors, their physical pain and their psychosocial pain. This last one is also known as "suffering" and it is reflection of how physical pain affects you socially and psychologically. From now on, report them separately. From this point on, when asked to report your pain level, report only your physical pain. Use the following table for reference.  Pain Clinic Pain Levels (0-5/10)  Pain Level Score  Description  No Pain 0   Mild pain 1 Nagging, annoying, but does not interfere with basic activities of daily living (ADL). Patients are able to eat, bathe, get dressed, toileting (being able to get on and off the toilet and perform personal hygiene functions), transfer (move in and out of bed or a chair without assistance), and maintain continence (able to control bladder and bowel functions). Blood pressure and heart rate are unaffected. A normal heart rate for a healthy adult ranges from 60 to 100 bpm (beats per minute).   Mild to moderate pain 2 Noticeable and distracting. Impossible to hide from other people. More frequent flare-ups. Still possible to adapt and function close to normal. It can be very annoying and may have occasional stronger flare-ups. With discipline, patients may get used to it and adapt.   Moderate pain 3 Interferes significantly with activities of daily living (ADL). It becomes difficult to feed, bathe, get  dressed, get on and off the toilet or to perform personal hygiene functions. Difficult to get in and out of bed or a chair without assistance. Very distracting. With effort, it can be ignored when deeply involved in activities.   Moderately severe pain 4 Impossible to ignore for more than a few minutes. With effort, patients may still be able to manage work or participate in some social activities. Very difficult to concentrate. Signs of autonomic nervous system discharge are evident: dilated pupils (mydriasis); mild sweating (diaphoresis); sleep interference. Heart rate becomes elevated (>115 bpm). Diastolic blood pressure (lower number) rises above 100 mmHg. Patients find relief in laying down and not moving.   Severe pain 5 Intense and extremely unpleasant. Associated with frowning face and frequent crying. Pain overwhelms the senses.  Ability to do any activity or maintain social relationships becomes significantly limited. Conversation becomes difficult. Pacing back and forth is common, as getting into a comfortable position is nearly impossible. Pain wakes you up from deep sleep. Physical signs will be obvious: pupillary dilation; increased sweating; goosebumps; brisk reflexes; cold, clammy hands and feet; nausea, vomiting or dry heaves; loss of appetite; significant sleep disturbance with inability to fall asleep or to remain asleep. When persistent, significant weight loss is observed due to the complete loss of appetite and sleep deprivation.  Blood pressure and heart rate becomes significantly elevated. Caution: If elevated blood pressure triggers a pounding headache associated with blurred vision, then the patient should immediately seek attention at an urgent or emergency care unit, as these may be signs of an impending stroke.    Emergency Department Pain Levels (6-10/10)  Emergency Room Pain 6 Severely limiting. Requires emergency care and should not be seen or managed at an outpatient pain  management facility. Communication becomes difficult and requires great effort. Assistance to reach the emergency department may be required. Facial flushing and profuse sweating along with potentially dangerous increases in heart rate and blood pressure will be evident.   Distressing pain 7 Self-care is very difficult. Assistance is required to transport, or use restroom. Assistance to reach the emergency department will be required. Tasks requiring coordination, such  as bathing and getting dressed become very difficult.   Disabling pain 8 Self-care is no longer possible. At this level, pain is disabling. The individual is unable to do even the most "basic" activities such as walking, eating, bathing, dressing, transferring to a bed, or toileting. Fine motor skills are lost. It is difficult to think clearly.   Incapacitating pain 9 Pain becomes incapacitating. Thought processing is no longer possible. Difficult to remember your own name. Control of movement and coordination are lost.   The worst pain imaginable 10 At this level, most patients pass out from pain. When this level is reached, collapse of the autonomic nervous system occurs, leading to a sudden drop in blood pressure and heart rate. This in turn results in a temporary and dramatic drop in blood flow to the brain, leading to a loss of consciousness. Fainting is one of the body's self defense mechanisms. Passing out puts the brain in a calmed state and causes it to shut down for a while, in order to begin the healing process.    Summary: 1. Refer to this scale when providing [place] with your pain level. 2. Be accurate and careful when reporting your pain level. This will help with your care. 3. Over-reporting your pain level will lead to loss of credibility. 4. Even a level of 1/10 means that there is pain and will be treated at our facility. 5. High, inaccurate reporting will be documented as "Symptom Exaggeration", leading to loss of  credibility and suspicions of possible secondary gains such as obtaining more narcotics, or wanting to appear disabled, for fraudulent reasons. 6. Only pain levels of 5 or below will be seen at our facility. 7. Pain levels of 6 and above will be sent to the Emergency Department and the appointment cancelled. ____________________________________________________________________________________________  BMI Assessment: Estimated body mass index is 40.72 kg/m as calculated from the following:   Height as of this encounter: _0  (1.702 m).   Weight as of this encounter: 260 lb (117.9 kg).  BMI interpretation table: BMI level Category Range association with higher incidence of chronic pain  <18 kg/m2 Underweight   18.5-24.9 kg/m2 Ideal body weight   25-29.9 kg/m2 Overweight Increased incidence by 20%  30-34.9 kg/m2 Obese (Class I) Increased incidence by 68%  35-39.9 kg/m2 Severe obesity (Class II) Increased incidence by 136%  >40 kg/m2 Extreme obesity (Class III) Increased incidence by 254%   BMI Readings from Last 4 Encounters:  02/14/17 40.72 kg/m  10/09/16 40.10 kg/m   Wt Readings from Last 4 Encounters:  02/14/17 260 lb (117.9 kg)  10/09/16 256 lb (116.1 kg)

## 2017-02-14 NOTE — Progress Notes (Signed)
Safety precautions to be maintained throughout the outpatient stay will include: orient to surroundings, keep bed in low position, maintain call bell within reach at all times, provide assistance with transfer out of bed and ambulation.  

## 2017-02-18 LAB — COMPLIANCE DRUG ANALYSIS, UR

## 2017-02-19 ENCOUNTER — Other Ambulatory Visit: Payer: Self-pay | Admitting: Nurse Practitioner

## 2017-02-19 ENCOUNTER — Encounter: Payer: Self-pay | Admitting: Nurse Practitioner

## 2017-02-19 DIAGNOSIS — E559 Vitamin D deficiency, unspecified: Secondary | ICD-10-CM | POA: Insufficient documentation

## 2017-02-19 LAB — COMPREHENSIVE METABOLIC PANEL
A/G RATIO: 1.6 (ref 1.2–2.2)
ALK PHOS: 186 IU/L — AB (ref 39–117)
ALT: 15 IU/L (ref 0–32)
AST: 20 IU/L (ref 0–40)
Albumin: 4.4 g/dL (ref 3.6–4.8)
BUN/Creatinine Ratio: 16 (ref 12–28)
BUN: 13 mg/dL (ref 8–27)
Bilirubin Total: 0.4 mg/dL (ref 0.0–1.2)
CO2: 21 mmol/L (ref 20–29)
Calcium: 10 mg/dL (ref 8.7–10.3)
Chloride: 97 mmol/L (ref 96–106)
Creatinine, Ser: 0.8 mg/dL (ref 0.57–1.00)
GFR calc Af Amer: 90 mL/min/{1.73_m2} (ref >59)
GFR, EST NON AFRICAN AMERICAN: 78 mL/min/{1.73_m2} (ref >59)
Globulin, Total: 2.8 g/dL (ref 1.5–4.5)
Glucose: 96 mg/dL (ref 65–99)
POTASSIUM: 4.9 mmol/L (ref 3.5–5.2)
Sodium: 137 mmol/L (ref 134–144)
Total Protein: 7.2 g/dL (ref 6.0–8.5)

## 2017-02-19 LAB — SEDIMENTATION RATE: SED RATE: 39 mm/h (ref 0–40)

## 2017-02-19 LAB — VITAMIN B12: Vitamin B-12: 267 pg/mL (ref 232–1245)

## 2017-02-19 LAB — MAGNESIUM: MAGNESIUM: 1.8 mg/dL (ref 1.6–2.3)

## 2017-02-19 LAB — C-REACTIVE PROTEIN: CRP: 10.7 mg/L — AB (ref 0.0–4.9)

## 2017-02-19 LAB — 25-HYDROXY VITAMIN D LCMS D2+D3
25-Hydroxy, Vitamin D-2: <1 ng/mL
25-Hydroxy, Vitamin D: 11 ng/mL — ABNORMAL LOW

## 2017-02-19 LAB — 25-HYDROXYVITAMIN D LCMS D2+D3: 25-HYDROXY, VITAMIN D-3: 11 ng/mL

## 2017-02-19 MED ORDER — ERGOCALCIFEROL 1.25 MG (50000 UT) PO CAPS: 50000.0000 [IU] | ORAL_CAPSULE | ORAL | 0 refills | Status: DC

## 2017-02-20 ENCOUNTER — Other Ambulatory Visit: Payer: Self-pay | Admitting: Nurse Practitioner

## 2017-02-20 ENCOUNTER — Telehealth: Payer: Self-pay | Admitting: *Deleted

## 2017-02-20 DIAGNOSIS — E559 Vitamin D deficiency, unspecified: Secondary | ICD-10-CM

## 2017-02-20 MED ORDER — ERGOCALCIFEROL 1.25 MG (50000 UT) PO CAPS: 50000.0000 [IU] | ORAL_CAPSULE | ORAL | 0 refills | Status: AC

## 2017-02-20 NOTE — Telephone Encounter (Signed)
Results and instructions read to patient.

## 2017-02-20 NOTE — Telephone Encounter (Signed)
Attempted to call patient to inform her of Vitamin D results. Message left. 

## 2017-02-20 NOTE — Telephone Encounter (Signed)
-----   Message from Vevelyn Francois, NP sent at 02/19/2017  3:41 PM EDT ----- Regarding: Vitamin D Please call pt and make her aware that her Vitamin D level is 11.  I have sent her in a RX to the pharmacy on file for Vitamin D 50, 000 units for 12 weeks.  She will then start OTC daily vitamin d 1000 to 2000 units .  Pt to have vitamin D level rechecked annually. Thanks

## 2017-03-15 ENCOUNTER — Other Ambulatory Visit: Payer: Self-pay | Admitting: Nurse Practitioner

## 2017-03-15 DIAGNOSIS — Z1231 Encounter for screening mammogram for malignant neoplasm of breast: Secondary | ICD-10-CM

## 2017-05-10 ENCOUNTER — Ambulatory Visit: Payer: Medicare Other

## 2017-05-28 ENCOUNTER — Ambulatory Visit: Payer: Medicare Other

## 2017-06-18 ENCOUNTER — Ambulatory Visit
Admission: RE | Admit: 2017-06-18 | Discharge: 2017-06-18 | Disposition: A | Discharge status: Home / Self Care | Payer: Medicare Other | Source: Ambulatory Visit | Attending: Nurse Practitioner | Admitting: Nurse Practitioner

## 2017-06-18 DIAGNOSIS — Z1231 Encounter for screening mammogram for malignant neoplasm of breast: Secondary | ICD-10-CM | POA: Insufficient documentation

## 2017-12-26 ENCOUNTER — Other Ambulatory Visit: Payer: Self-pay

## 2017-12-26 ENCOUNTER — Encounter (INDEPENDENT_AMBULATORY_CARE_PROVIDER_SITE_OTHER): Payer: Self-pay

## 2017-12-26 ENCOUNTER — Ambulatory Visit (INDEPENDENT_AMBULATORY_CARE_PROVIDER_SITE_OTHER): Payer: Medicare Other | Admitting: Gastroenterology

## 2017-12-26 ENCOUNTER — Encounter: Payer: Self-pay | Admitting: Gastroenterology

## 2017-12-26 ENCOUNTER — Telehealth: Payer: Self-pay | Admitting: Gastroenterology

## 2017-12-26 VITALS — BP 119/80 | HR 84 | Ht 67.0 in | Wt 257.0 lb

## 2017-12-26 DIAGNOSIS — K921 Melena: Secondary | ICD-10-CM

## 2017-12-26 DIAGNOSIS — R748 Abnormal levels of other serum enzymes: Secondary | ICD-10-CM | POA: Diagnosis not present

## 2017-12-26 NOTE — Telephone Encounter (Signed)
LVM for pt to return my call.

## 2017-12-26 NOTE — Telephone Encounter (Signed)
Patient LVM that she has a driver for her procedure on the 25th and she would like for you to return her call.

## 2017-12-26 NOTE — Progress Notes (Signed)
Primary Care Physician: Perrin Maltese, MD  Primary Gastroenterologist:  Dr. [name]  Chief Complaint  Patient presents with  . Blood In Stools    HPI: Diane Morris is a 67 y.o. female here for positive Hemoccult cards 3.  The patient has a history of colon polyps that have been adenomas.  The patient's most recent colonoscopy was last year and it showed a adenomatous polyp. At that time the patient was recommended to have a repeat colonoscopy in 5 years. The patient also has had an elevated alkaline phosphatase.  There is some history back in 2017 of the patient having a hepatitis Be Ag positive with a negative workup.  There was a negative viral load found.  The patient reports that when she was found to have the heme positive stool card she was seeing some dark blood in the toilet that sank to the bottom.  She denies any abdominal pain but does report a history of diverticulitis being treated with antibiotics in the past.  Is no report of any unexplained weight loss fevers chills nausea vomiting.  Current Outpatient Medications  Medication Sig Dispense Refill  . albuterol (PROVENTIL HFA;VENTOLIN HFA) 108 (90 Base) MCG/ACT inhaler Inhale into the lungs every 6 (six) hours as needed for wheezing or shortness of breath.    . Apremilast 30 MG TABS Take 1 tablet by mouth 2 (two) times daily.    Marland Kitchen atorvastatin (LIPITOR) 20 MG tablet 40 mg at bedtime.     Marland Kitchen atorvastatin (LIPITOR) 40 MG tablet Take 40 mg by mouth daily.  2  . cyclobenzaprine (FLEXERIL) 10 MG tablet 10 mg 3 (three) times daily as needed.     . dapagliflozin propanediol (FARXIGA) 10 MG TABS tablet Take by mouth.    . fluticasone (FLONASE) 50 MCG/ACT nasal spray Place 1 spray into the nose daily as needed.    . gabapentin (NEURONTIN) 300 MG capsule Take 1 tab 2 to 3 times a day as needed    . lisinopril-hydrochlorothiazide (PRINZIDE,ZESTORETIC) 10-12.5 MG tablet once a day    . metoprolol succinate (TOPROL-XL) 100 MG 24  hr tablet Take 100 mg by mouth daily. Take with or immediately following a meal.    . pantoprazole (PROTONIX) 40 MG tablet 40 mg 2 (two) times daily.     . Loratadine 10 MG CAPS Take by mouth daily.    . rivaroxaban (XARELTO) 20 MG TABS tablet Take 20 mg by mouth daily with supper.     . sertraline (ZOLOFT) 100 MG tablet Take 50 mg by mouth daily.      No current facility-administered medications for this visit.     Allergies as of 12/26/2017 - Review Complete 12/26/2017  Allergen Reaction Noted  . Bee venom Swelling 02/14/2017  . Contrast media [iodinated diagnostic agents] Shortness Of Breath 10/03/2016  . Codeine Nausea And Vomiting 09/19/2012  . Shellfish allergy Nausea And Vomiting, Swelling, and Rash 09/19/2012    ROS:  General: Negative for anorexia, weight loss, fever, chills, fatigue, weakness. ENT: Negative for hoarseness, difficulty swallowing , nasal congestion. CV: Negative for chest pain, angina, palpitations, dyspnea on exertion, peripheral edema.  Respiratory: Negative for dyspnea at rest, dyspnea on exertion, cough, sputum, wheezing.  GI: See history of present illness. GU:  Negative for dysuria, hematuria, urinary incontinence, urinary frequency, nocturnal urination.  Endo: Negative for unusual weight change.    Physical Examination:   BP 119/80   Pulse 84   Ht 5\' 7"  (1.702 m)  Wt 257 lb (116.6 kg)   BMI 40.25 kg/m   General: Well-nourished, well-developed in no acute distress.  Eyes: No icterus. Conjunctivae pink. Mouth: Oropharyngeal mucosa moist and pink , no lesions erythema or exudate. Lungs: Clear to auscultation bilaterally. Non-labored. Heart: Regular rate and rhythm, no murmurs rubs or gallops.  Abdomen: Bowel sounds are normal, nontender, nondistended, no hepatosplenomegaly or masses, no abdominal bruits or hernia , no rebound or guarding.   Extremities: No lower extremity edema. No clubbing or deformities. Neuro: Alert and oriented x 3.   Grossly intact. Skin: Warm and dry, no jaundice.   Psych: Alert and cooperative, normal mood and affect.  Labs:    Imaging Studies: No results found.  Assessment and Plan:   Diane Morris is a 67 y.o. y/o female who comes in today with a history of colon polyps with her last colonoscopy a year ago and an adenomatous polyp found at that time.  The patient now reports seeing dark material that she thought was blood in the toilet and heme positive stools.  The patient will be set up for an EGD to look for any peptic ulcer disease as the cause of her blood in her stools.  The patient will also have her alkaline phosphatase fractionated and her blood sent off for AMA.  The patient has been explained the plan and agrees with it.    [name], MD. Marval Regal   Note: This dictation was prepared with Dragon dictation along with smaller phrase technology. Any transcriptional errors that result from this process are unintentional.

## 2017-12-27 ENCOUNTER — Other Ambulatory Visit: Payer: Self-pay

## 2017-12-27 ENCOUNTER — Encounter: Payer: Self-pay | Admitting: *Deleted

## 2017-12-27 NOTE — Telephone Encounter (Signed)
Returned pt's call and confirmed pt's procedure is scheduled for 01/03/18.

## 2017-12-29 LAB — ALKALINE PHOSPHATASE, ISOENZYMES
Alkaline Phosphatase: 206 IU/L — ABNORMAL HIGH (ref 39–117)
BONE FRACTION: 25 % (ref 14–68)
INTESTINAL FRAC.: 3 % (ref 0–18)
LIVER FRACTION: 72 % (ref 18–85)

## 2017-12-29 LAB — MITOCHONDRIAL ANTIBODIES: Mitochondrial Ab: <20 Units (ref 0.0–20.0)

## 2018-01-02 ENCOUNTER — Telehealth: Payer: Self-pay | Admitting: Gastroenterology

## 2018-01-02 NOTE — Telephone Encounter (Signed)
Patient called for results °

## 2018-01-02 NOTE — Discharge Instructions (Signed)
General Anesthesia, Adult, Care After °These instructions provide you with information about caring for yourself after your procedure. Your health care provider may also give you more specific instructions. Your treatment has been planned according to current medical practices, but problems sometimes occur. Call your health care provider if you have any problems or questions after your procedure. °What can I expect after the procedure? °After the procedure, it is common to have: °· Vomiting. °· A sore throat. °· Mental slowness. ° °It is common to feel: °· Nauseous. °· Cold or shivery. °· Sleepy. °· Tired. °· Sore or achy, even in parts of your body where you did not have surgery. ° °Follow these instructions at home: °For at least 24 hours after the procedure: °· Do not: °? Participate in activities where you could fall or become injured. °? Drive. °? Use heavy machinery. °? Drink alcohol. °? Take sleeping pills or medicines that cause drowsiness. °? Make important decisions or sign legal documents. °? Take care of children on your own. °· Rest. °Eating and drinking °· If you vomit, drink water, juice, or soup when you can drink without vomiting. °· Drink enough fluid to keep your urine clear or pale yellow. °· Make sure you have little or no nausea before eating solid foods. °· Follow the diet recommended by your health care provider. °General instructions °· Have a responsible adult stay with you until you are awake and alert. °· Return to your normal activities as told by your health care provider. Ask your health care provider what activities are safe for you. °· Take over-the-counter and prescription medicines only as told by your health care provider. °· If you smoke, do not smoke without supervision. °· Keep all follow-up visits as told by your health care provider. This is important. °Contact a health care provider if: °· You continue to have nausea or vomiting at home, and medicines are not helpful. °· You  cannot drink fluids or start eating again. °· You cannot urinate after 8-12 hours. °· You develop a skin rash. °· You have fever. °· You have increasing redness at the site of your procedure. °Get help right away if: °· You have difficulty breathing. °· You have chest pain. °· You have unexpected bleeding. °· You feel that you are having a life-threatening or urgent problem. °This information is not intended to replace advice given to you by your health care provider. Make sure you discuss any questions you have with your health care provider. °Document Released: 09/04/2000 Document Revised: 11/01/2015 Document Reviewed: 05/13/2015 °Elsevier Interactive Patient Education © 2018 [employer] ° °

## 2018-01-03 ENCOUNTER — Encounter
Admission: RE | Disposition: A | Discharge status: Home / Self Care | Payer: Self-pay | Source: Ambulatory Visit | Attending: Gastroenterology

## 2018-01-03 ENCOUNTER — Ambulatory Visit: Payer: Medicare Other | Admitting: Anesthesiology

## 2018-01-03 ENCOUNTER — Ambulatory Visit
Admission: RE | Admit: 2018-01-03 | Discharge: 2018-01-03 | Disposition: A | Discharge status: Home / Self Care | Payer: Medicare Other | Source: Ambulatory Visit | Attending: Gastroenterology | Admitting: Gastroenterology

## 2018-01-03 DIAGNOSIS — K2970 Gastritis, unspecified, without bleeding: Secondary | ICD-10-CM | POA: Insufficient documentation

## 2018-01-03 DIAGNOSIS — L4050 Arthropathic psoriasis, unspecified: Secondary | ICD-10-CM | POA: Insufficient documentation

## 2018-01-03 DIAGNOSIS — K2900 Acute gastritis without bleeding: Secondary | ICD-10-CM

## 2018-01-03 DIAGNOSIS — Z7951 Long term (current) use of inhaled steroids: Secondary | ICD-10-CM | POA: Diagnosis not present

## 2018-01-03 DIAGNOSIS — Z7984 Long term (current) use of oral hypoglycemic drugs: Secondary | ICD-10-CM | POA: Diagnosis not present

## 2018-01-03 DIAGNOSIS — E119 Type 2 diabetes mellitus without complications: Secondary | ICD-10-CM | POA: Insufficient documentation

## 2018-01-03 DIAGNOSIS — Z8719 Personal history of other diseases of the digestive system: Secondary | ICD-10-CM

## 2018-01-03 DIAGNOSIS — Z7901 Long term (current) use of anticoagulants: Secondary | ICD-10-CM | POA: Diagnosis not present

## 2018-01-03 DIAGNOSIS — K219 Gastro-esophageal reflux disease without esophagitis: Secondary | ICD-10-CM | POA: Diagnosis not present

## 2018-01-03 DIAGNOSIS — E7800 Pure hypercholesterolemia, unspecified: Secondary | ICD-10-CM | POA: Diagnosis not present

## 2018-01-03 DIAGNOSIS — R195 Other fecal abnormalities: Secondary | ICD-10-CM | POA: Diagnosis present

## 2018-01-03 DIAGNOSIS — F329 Major depressive disorder, single episode, unspecified: Secondary | ICD-10-CM | POA: Diagnosis not present

## 2018-01-03 DIAGNOSIS — Z79899 Other long term (current) drug therapy: Secondary | ICD-10-CM | POA: Insufficient documentation

## 2018-01-03 DIAGNOSIS — I10 Essential (primary) hypertension: Secondary | ICD-10-CM | POA: Insufficient documentation

## 2018-01-03 DIAGNOSIS — K921 Melena: Secondary | ICD-10-CM

## 2018-01-03 DIAGNOSIS — Z87891 Personal history of nicotine dependence: Secondary | ICD-10-CM | POA: Diagnosis not present

## 2018-01-03 HISTORY — PX: ESOPHAGOGASTRODUODENOSCOPY (EGD) WITH PROPOFOL: SHX5813

## 2018-01-03 HISTORY — DX: Benign paroxysmal vertigo, unspecified ear: H81.10

## 2018-01-03 LAB — GLUCOSE, CAPILLARY
GLUCOSE-CAPILLARY: 106 mg/dL — AB (ref 70–99)
Glucose-Capillary: 120 mg/dL — ABNORMAL HIGH (ref 70–99)

## 2018-01-03 SURGERY — ESOPHAGOGASTRODUODENOSCOPY (EGD) WITH PROPOFOL
Anesthesia: General | Wound class: Clean Contaminated

## 2018-01-03 MED ORDER — LACTATED RINGERS IV SOLN
INTRAVENOUS | Status: DC
  Administered 2018-01-03: 08:00:00 via INTRAVENOUS

## 2018-01-03 MED ORDER — PROMETHAZINE HCL 25 MG/ML IJ SOLN: 6.2500 mg | INTRAMUSCULAR | Status: DC | PRN

## 2018-01-03 MED ORDER — METOPROLOL TARTRATE 5 MG/5ML IV SOLN
2.5000 mg | Freq: Once | INTRAVENOUS | Status: AC
  Administered 2018-01-03: 2.5 mg via INTRAVENOUS

## 2018-01-03 MED ORDER — FENTANYL CITRATE (PF) 100 MCG/2ML IJ SOLN: 25.0000 ug | INTRAMUSCULAR | Status: DC | PRN

## 2018-01-03 MED ORDER — PROPOFOL 10 MG/ML IV BOLUS
INTRAVENOUS | Status: DC | PRN
  Administered 2018-01-03: 40 mg via INTRAVENOUS
  Administered 2018-01-03: 100 mg via INTRAVENOUS
  Administered 2018-01-03 (×2): 40 mg via INTRAVENOUS

## 2018-01-03 MED ORDER — SODIUM CHLORIDE 0.9 % IV SOLN: INTRAVENOUS | Status: DC

## 2018-01-03 MED ORDER — LIDOCAINE HCL (CARDIAC) PF 100 MG/5ML IV SOSY
PREFILLED_SYRINGE | INTRAVENOUS | Status: DC | PRN
  Administered 2018-01-03: 30 mg via INTRAVENOUS

## 2018-01-03 SURGICAL SUPPLY — 33 items
BALLN DILATOR 10-12 8 (BALLOONS)
BALLN DILATOR 12-15 8 (BALLOONS)
BALLN DILATOR 15-18 8 (BALLOONS)
BALLN DILATOR CRE 0-12 8 (BALLOONS)
BALLN DILATOR ESOPH 8 10 CRE (MISCELLANEOUS) IMPLANT
BALLOON DILATOR 12-15 8 (BALLOONS) IMPLANT
BALLOON DILATOR 15-18 8 (BALLOONS) IMPLANT
BALLOON DILATOR CRE 0-12 8 (BALLOONS) IMPLANT
BLOCK BITE 60FR ADLT L/F GRN (MISCELLANEOUS) ×3 IMPLANT
CANISTER SUCT 1200ML W/VALVE (MISCELLANEOUS) ×3 IMPLANT
CLIP HMST 235XBRD CATH ROT (MISCELLANEOUS) IMPLANT
CLIP RESOLUTION 360 11X235 (MISCELLANEOUS)
ELECT REM PT RETURN 9FT ADLT (ELECTROSURGICAL)
ELECTRODE REM PT RTRN 9FT ADLT (ELECTROSURGICAL) IMPLANT
FCP ESCP3.2XJMB 240X2.8X (MISCELLANEOUS)
FORCEPS BIOP RAD 4 LRG CAP 4 (CUTTING FORCEPS) ×3 IMPLANT
FORCEPS BIOP RJ4 240 W/NDL (MISCELLANEOUS)
FORCEPS ESCP3.2XJMB 240X2.8X (MISCELLANEOUS) IMPLANT
GOWN CVR UNV OPN BCK APRN NK (MISCELLANEOUS) ×2 IMPLANT
GOWN ISOL THUMB LOOP REG UNIV (MISCELLANEOUS) ×4
INJECTOR VARIJECT VIN23 (MISCELLANEOUS) IMPLANT
KIT DEFENDO VALVE AND CONN (KITS) IMPLANT
KIT ENDO PROCEDURE OLY (KITS) ×3 IMPLANT
MARKER SPOT ENDO TATTOO 5ML (MISCELLANEOUS) IMPLANT
RETRIEVER NET PLAT FOOD (MISCELLANEOUS) IMPLANT
SNARE SHORT THROW 13M SML OVAL (MISCELLANEOUS) IMPLANT
SNARE SHORT THROW 30M LRG OVAL (MISCELLANEOUS) IMPLANT
SPOT EX ENDOSCOPIC TATTOO (MISCELLANEOUS)
SYR INFLATION 60ML (SYRINGE) IMPLANT
TRAP ETRAP POLY (MISCELLANEOUS) IMPLANT
VARIJECT INJECTOR VIN23 (MISCELLANEOUS)
WATER STERILE IRR 250ML POUR (IV SOLUTION) ×3 IMPLANT
WIRE CRE 18-20MM 8CM F G (MISCELLANEOUS) IMPLANT

## 2018-01-03 NOTE — Transfer of Care (Signed)
Immediate Anesthesia Transfer of Care Note  Patient: Kalis Wiacek  Procedure(s) Performed: ESOPHAGOGASTRODUODENOSCOPY (EGD) WITH PROPOFOL (N/A )  Patient Location: PACU  Anesthesia Type: General  Level of Consciousness: awake, alert  and patient cooperative  Airway and Oxygen Therapy: Patient Spontanous Breathing and Patient connected to supplemental oxygen  Post-op Assessment: Post-op Vital signs reviewed, Patient's Cardiovascular Status Stable, Respiratory Function Stable, Patent Airway and No signs of Nausea or vomiting  Post-op Vital Signs: Reviewed and stable  Complications: No apparent anesthesia complications

## 2018-01-03 NOTE — Telephone Encounter (Signed)
Pt notified of lab results with Dr. Allen Norris today, 01/03/18 prior to procedure.

## 2018-01-03 NOTE — H&P (Signed)
Diane Lame, MD [address], [place] [address] Phone:[phone] Fax : [phone]  Primary Care Physician:  Perrin Maltese, MD Primary Gastroenterologist:  Dr. Allen Norris  Pre-Procedure History & Physical: HPI:  Diane Morris is a 67 y.o. female is here for an EGD.   Past Medical History:  Diagnosis Date  . BPPV (benign paroxysmal positional vertigo)   . Depression   . Diabetes mellitus type 2, noninsulin dependent ([place])   . Dysrhythmia    tachycardia - controlled with metoprolol  . GERD (gastroesophageal reflux disease)   . Hepatitis A    as teenager. treated  . Hepatitis B 12/2015   positive test, patient states she does not have hepatitis B, last panel was negative which was the 3rd panel  . Hx of blood clots 2016   left calf  . Hypercholesteremia   . Hypertension   . Psoriatic arthritis ([place])   . Sciatica of left side   . Vertigo    when anxious    Past Surgical History:  Procedure Laterality Date  . COLONOSCOPY    . COLONOSCOPY WITH PROPOFOL N/A 10/09/2016   Procedure: COLONOSCOPY WITH PROPOFOL;  Surgeon: Diane Lame, MD;  Location: [facility];  Service: Endoscopy;  Laterality: N/A;  Diabetic - oral meds  . DILATION AND CURETTAGE OF UTERUS    . INDUCED ABORTION    . POLYPECTOMY  10/09/2016   Procedure: POLYPECTOMY;  Surgeon: Diane Lame, MD;  Location: [facility];  Service: Endoscopy;;  . TUBAL LIGATION      Prior to Admission medications   Medication Sig Start Date End Date Taking? Authorizing Provider  Apremilast 30 MG TABS Take 1 tablet by mouth 2 (two) times daily. 12/28/16  Yes [provider]  atorvastatin (LIPITOR) 20 MG tablet 40 mg at bedtime.  03/14/16  Yes [provider]  atorvastatin (LIPITOR) 40 MG tablet Take 40 mg by mouth daily. 10/29/17  Yes [provider]  cyclobenzaprine (FLEXERIL) 10 MG tablet 10 mg 3 (three) times daily as needed.  08/15/15  Yes [provider]    dapagliflozin propanediol (FARXIGA) 10 MG TABS tablet Take by mouth. 08/27/17  Yes [provider]  fluticasone (FLONASE) 50 MCG/ACT nasal spray Place 1 spray into the nose daily as needed. 08/15/15  Yes [provider]  gabapentin (NEURONTIN) 300 MG capsule Take 1 tab 2 to 3 times a day as needed 11/19/15  Yes [provider]  lisinopril-hydrochlorothiazide (PRINZIDE,ZESTORETIC) 10-12.5 MG tablet once a day 02/10/15  Yes [provider]  metoprolol succinate (TOPROL-XL) 100 MG 24 hr tablet Take 100 mg by mouth daily. Take with or immediately following a meal.   Yes [provider]  pantoprazole (PROTONIX) 40 MG tablet 40 mg 2 (two) times daily.  08/15/15  Yes [provider]  albuterol (PROVENTIL HFA;VENTOLIN HFA) 108 (90 Base) MCG/ACT inhaler Inhale into the lungs every 6 (six) hours as needed for wheezing or shortness of breath.    [provider]  Loratadine 10 MG CAPS Take by mouth daily.    [provider]  rivaroxaban (XARELTO) 20 MG TABS tablet Take 20 mg by mouth daily with supper.  08/17/15   [provider]  sertraline (ZOLOFT) 100 MG tablet Take 50 mg by mouth daily.  08/15/15   [provider]    Allergies as of 12/26/2017 - Review Complete 12/26/2017  Allergen Reaction Noted  . Bee venom Swelling 02/14/2017  . Contrast media [iodinated  diagnostic agents] Shortness Of Breath 10/03/2016  . Codeine Nausea And Vomiting 09/19/2012  . Shellfish allergy Nausea And Vomiting, Swelling, and Rash 09/19/2012    Family History  Problem Relation Age of Onset  . Congestive Heart Failure Mother   . Emphysema Mother   . Heart disease Father   . Hypertension Sister   . Cancer Brother   . Hypertension Brother   . Breast cancer Neg Hx     Social History   Socioeconomic History  . Marital status: Widowed    Spouse name: Not on file  . Number of children: Not on file  . Years of education: Not on file  .  Highest education level: Not on file  Occupational History  . Not on file  Social Needs  . Financial resource strain: Not on file  . Food insecurity:    Worry: Not on file    Inability: Not on file  . Transportation needs:    Medical: Not on file    Non-medical: Not on file  Tobacco Use  . Smoking status: Former Smoker    Last attempt to quit: 06/12/2006    Years since quitting: 11.5  . Smokeless tobacco: Never Used  Substance and Sexual Activity  . Alcohol use: No  . Drug use: No  . Sexual activity: Not on file  Lifestyle  . Physical activity:    Days per week: Not on file    Minutes per session: Not on file  . Stress: Not on file  Relationships  . Social connections:    Talks on phone: Not on file    Gets together: Not on file    Attends religious service: Not on file    Active member of club or organization: Not on file    Attends meetings of clubs or organizations: Not on file    Relationship status: Not on file  . Intimate partner violence:    Fear of current or ex partner: Not on file    Emotionally abused: Not on file    Physically abused: Not on file    Forced sexual activity: Not on file  Other Topics Concern  . Not on file  Social History Narrative  . Not on file    Review of Systems: See HPI, otherwise negative ROS  Physical Exam: BP 110/73   Pulse 76   Temp 97.6 F (36.4 C) (Temporal)   Resp 16   Ht 5\' 7"  (1.702 m)   Wt 258 lb (117 kg)   SpO2 99%   BMI 40.41 kg/m  General:   Alert,  pleasant and cooperative in NAD Head:  Normocephalic and atraumatic. Neck:  Supple; no masses or thyromegaly. Lungs:  Clear throughout to auscultation.    Heart:  Regular rate and rhythm. Abdomen:  Soft, nontender and nondistended. Normal bowel sounds, without guarding, and without rebound.   Neurologic:  Alert and  oriented x4;  grossly normal neurologically.  Impression/Plan: [name] is here for an endoscopy to be performed for heme positive  stools  Risks, benefits, limitations, and alternatives regarding  endoscopy have been reviewed with the patient.  Questions have been answered.  All parties agreeable.   Diane Lame, MD  01/03/2018, 7:57 AM

## 2018-01-03 NOTE — Anesthesia Postprocedure Evaluation (Signed)
Anesthesia Post Note  Patient: Diane Morris  Procedure(s) Performed: ESOPHAGOGASTRODUODENOSCOPY (EGD) WITH PROPOFOL (N/A )  Patient location during evaluation: PACU Anesthesia Type: General Level of consciousness: awake and alert Pain management: pain level controlled Vital Signs Assessment: post-procedure vital signs reviewed and stable Respiratory status: spontaneous breathing, nonlabored ventilation, respiratory function stable and patient connected to nasal cannula oxygen Cardiovascular status: blood pressure returned to baseline and stable Postop Assessment: no apparent nausea or vomiting Anesthetic complications: no    Janeka Libman C

## 2018-01-03 NOTE — Anesthesia Procedure Notes (Signed)
Performed by: Keirsten Matuska, CRNA Pre-anesthesia Checklist: Patient identified, Emergency Drugs available, Suction available, Timeout performed and Patient being monitored Patient Re-evaluated:Patient Re-evaluated prior to induction Oxygen Delivery Method: Nasal cannula Placement Confirmation: positive ETCO2       

## 2018-01-03 NOTE — Anesthesia Preprocedure Evaluation (Signed)
Anesthesia Evaluation    Airway Mallampati: II  TM Distance: >3 FB Neck ROM: Full    Dental no notable dental hx.    Pulmonary neg pulmonary ROS, former smoker,    Pulmonary exam normal breath sounds clear to auscultation       Cardiovascular hypertension, Normal cardiovascular exam Rhythm:Regular Rate:Normal     Neuro/Psych Depression  Neuromuscular disease    GI/Hepatic GERD  Controlled,(+) Hepatitis -, A  Endo/Other  diabetes  Renal/GU      Musculoskeletal   Abdominal   Peds negative pediatric ROS (+)  Hematology   Anesthesia Other Findings   Reproductive/Obstetrics                             Anesthesia Physical Anesthesia Plan  ASA: II  Anesthesia Plan: General   Post-op Pain Management:    Induction: Intravenous  PONV Risk Score and Plan:   Airway Management Planned: Natural Airway  Additional Equipment:   Intra-op Plan:   Post-operative Plan: Extubation in OR  Informed Consent: I have reviewed the patients History and Physical, chart, labs and discussed the procedure including the risks, benefits and alternatives for the proposed anesthesia with the patient or authorized representative who has indicated his/her understanding and acceptance.   Dental advisory given  Plan Discussed with: CRNA  Anesthesia Plan Comments: (General IVA with natural airway)        Anesthesia Quick Evaluation

## 2018-01-03 NOTE — Op Note (Signed)
[facility]enter[facility] Patient Name: Diane Morris Procedure Date: 01/03/2018 8:24 AM MRN: [id number] Account #: [id number] Date of Birth: November 11, 1950 Admit Type: Outpatient Age: 67 Room: [facility] OR ROOM 01 Gender: Female Note Status: Finalized Procedure:            Upper GI endoscopy Indications:          Heme positive stool Providers:            [name] MD, MD Referring MD:         Perrin Maltese, MD (Referring MD) Medicines:            Propofol per Anesthesia Complications:        No immediate complications. Procedure:            Pre-Anesthesia Assessment:                       - Prior to the procedure, a History and Physical was                        performed, and patient medications and allergies were                        reviewed. The patient's tolerance of previous                        anesthesia was also reviewed. The risks and benefits of                        the procedure and the sedation options and risks were                        discussed with the patient. All questions were                        answered, and informed consent was obtained. Prior                        Anticoagulants: The patient has taken no previous                        anticoagulant or antiplatelet agents. ASA Grade                        Assessment: II - A patient with mild systemic disease.                        After reviewing the risks and benefits, the patient was                        deemed in satisfactory condition to undergo the                        procedure.                       After obtaining informed consent, the endoscope was                        passed under direct vision. Throughout the procedure,  the patient's blood pressure, pulse, and oxygen                        saturations were monitored continuously. The was                        introduced through the mouth, and advanced to the   second part of duodenum. The upper GI endoscopy was                        accomplished without difficulty. The patient tolerated                        the procedure well. Findings:      The examined esophagus was normal.      Localized mild inflammation characterized by erythema was found in the       gastric antrum. Biopsies were taken with a cold forceps for histology.      The examined duodenum was normal. Impression:           - Normal esophagus.                       - Gastritis. Biopsied.                       - Normal examined duodenum. Recommendation:       - Discharge patient to home.                       - Resume previous diet.                       - Continue present medications.                       - Await pathology results. Procedure Code(s):    --- Professional ---                       [phone], Esophagogastroduodenoscopy, flexible, transoral;                        with biopsy, single or multiple Diagnosis Code(s):    --- Professional ---                       R19.5, Other fecal abnormalities                       K29.70, Gastritis, unspecified, without bleeding CPT copyright 2017 American Medical Association. All rights reserved. The codes documented in this report are preliminary and upon coder review may  be revised to meet current compliance requirements. [name] MD, MD 01/03/2018 8:47:41 AM This report has been signed electronically. Number of Addenda: 0 Note Initiated On: 01/03/2018 8:24 AM Total Procedure Duration: 0 hours 3 minutes 24 seconds       [facility]

## 2018-01-04 ENCOUNTER — Encounter: Payer: Self-pay | Admitting: Gastroenterology

## 2018-01-07 ENCOUNTER — Encounter: Payer: Self-pay | Admitting: Gastroenterology

## 2018-01-08 ENCOUNTER — Encounter: Payer: Self-pay | Admitting: Gastroenterology

## 2018-02-20 LAB — HM DIABETES EYE EXAM

## 2018-04-30 ENCOUNTER — Other Ambulatory Visit: Payer: Self-pay | Admitting: Nurse Practitioner

## 2018-04-30 ENCOUNTER — Ambulatory Visit
Admission: RE | Admit: 2018-04-30 | Discharge: 2018-04-30 | Disposition: A | Discharge status: Home / Self Care | Payer: Medicare Other | Source: Ambulatory Visit | Attending: Nurse Practitioner | Admitting: Nurse Practitioner

## 2018-04-30 DIAGNOSIS — K760 Fatty (change of) liver, not elsewhere classified: Secondary | ICD-10-CM | POA: Diagnosis not present

## 2018-04-30 DIAGNOSIS — R1010 Upper abdominal pain, unspecified: Secondary | ICD-10-CM

## 2018-08-13 LAB — LIPID PANEL
Cholesterol: 158 (ref 0–200)
HDL: 34 — AB (ref 35–70)
LDL Cholesterol: 88
Triglycerides: 179 — AB (ref 40–160)

## 2018-08-13 LAB — CBC AND DIFFERENTIAL
HCT: 47 — AB (ref 36–46)
Hemoglobin: 14.9 (ref 12.0–16.0)
Platelets: 14 — AB (ref 150–399)
WBC: 9.9

## 2018-08-13 LAB — BASIC METABOLIC PANEL
BUN: 14 (ref 4–21)
Creatinine: 0.7 (ref 0.5–1.1)
Glucose: 110
Potassium: 5 (ref 3.4–5.3)
Sodium: 139 (ref 137–147)

## 2018-08-13 LAB — HEPATIC FUNCTION PANEL
ALT: 15 (ref 7–35)
AST: 15 (ref 13–35)

## 2018-08-13 LAB — HEMOGLOBIN A1C: Hemoglobin A1C: 6.4

## 2018-08-13 LAB — TSH: TSH: 2.01 (ref 0.41–5.90)

## 2018-08-13 LAB — VITAMIN B12: Vitamin B-12: 686

## 2019-01-31 ENCOUNTER — Encounter: Payer: Self-pay | Admitting: Internal Medicine

## 2019-01-31 ENCOUNTER — Other Ambulatory Visit: Payer: Self-pay

## 2019-01-31 ENCOUNTER — Ambulatory Visit (INDEPENDENT_AMBULATORY_CARE_PROVIDER_SITE_OTHER): Payer: Medicare Other | Admitting: Internal Medicine

## 2019-01-31 VITALS — BP 128/78 | HR 80 | Ht 67.0 in | Wt 254.0 lb

## 2019-01-31 DIAGNOSIS — M5442 Lumbago with sciatica, left side: Secondary | ICD-10-CM

## 2019-01-31 DIAGNOSIS — G5602 Carpal tunnel syndrome, left upper limb: Secondary | ICD-10-CM | POA: Diagnosis not present

## 2019-01-31 DIAGNOSIS — L4050 Arthropathic psoriasis, unspecified: Secondary | ICD-10-CM

## 2019-01-31 DIAGNOSIS — K219 Gastro-esophageal reflux disease without esophagitis: Secondary | ICD-10-CM

## 2019-01-31 DIAGNOSIS — E118 Type 2 diabetes mellitus with unspecified complications: Secondary | ICD-10-CM | POA: Diagnosis not present

## 2019-01-31 DIAGNOSIS — Z1231 Encounter for screening mammogram for malignant neoplasm of breast: Secondary | ICD-10-CM

## 2019-01-31 DIAGNOSIS — I10 Essential (primary) hypertension: Secondary | ICD-10-CM | POA: Diagnosis not present

## 2019-01-31 DIAGNOSIS — D126 Benign neoplasm of colon, unspecified: Secondary | ICD-10-CM

## 2019-01-31 DIAGNOSIS — G8929 Other chronic pain: Secondary | ICD-10-CM

## 2019-01-31 DIAGNOSIS — H8113 Benign paroxysmal vertigo, bilateral: Secondary | ICD-10-CM | POA: Insufficient documentation

## 2019-01-31 NOTE — Progress Notes (Signed)
Date:  01/31/2019   Name:  Diane Morris   DOB:  07/22/1950   MRN:  [id number]   Chief Complaint: Establish Care (Would like new referral for cardiology, rhuematology for her in [place]. Wants everything to be in [place] for now on.) Previously being seen at [facility].  She wants a PCP closer to home.  She does not get any immunizations.  Her last mammogram was 06/2017.  She had a colonoscopy in 2019 as well as an EGD. Hypertension This is a chronic problem. Episode onset: several years ago - has been seeing [facility] - stress test about 4 years ago was normal. The problem is unchanged. The problem is controlled. Associated symptoms include palpitations and shortness of breath (due to deconditioning). Pertinent negatives include no chest pain. There are no associated agents to hypertension. Risk factors for coronary artery disease include diabetes mellitus and dyslipidemia. Past treatments include beta blockers and ACE inhibitors. The current treatment provides significant improvement. There is no history of kidney disease, CAD/MI or heart failure.  Diabetes She presents for her follow-up diabetic visit. She has type 2 diabetes mellitus. The initial diagnosis of diabetes was made 5 years ago. Hypoglycemia symptoms include dizziness (intermittent vertigo from BPPV.). Pertinent negatives for diabetes include no chest pain, no fatigue, no foot paresthesias, no polydipsia, no polyuria, no weakness and no weight loss. Current diabetic treatment includes oral agent (monotherapy) (farxiga). She is compliant with treatment all of the time. Her weight is fluctuating minimally (had lost 20 lb after starting [place] but gained some back). She is following a generally healthy diet. She participates in exercise intermittently. There is no compliance with monitoring of blood glucose. An ACE inhibitor/angiotensin II receptor blocker is being taken. Eye exam is current.  Hyperlipidemia This is a  chronic problem. The problem is resistant. Associated symptoms include shortness of breath (due to deconditioning). Pertinent negatives include no chest pain. Current antihyperlipidemic treatment includes statins. The current treatment provides mild improvement of lipids. Compliance problems include adherence to diet.   Back Pain This is a chronic problem. The pain is present in the lumbar spine. The pain radiates to the left foot. The pain is moderate. Pertinent negatives include no abdominal pain, chest pain, fever, weakness or weight loss. She has tried muscle relaxant (previously on narcotics, then tramadol and now only taking gabapentin and nsaids if needed) for the symptoms. The treatment provided moderate relief.  Gastroesophageal Reflux She reports no abdominal pain, no chest pain, no coughing or no wheezing. The problem occurs occasionally. The problem has been unchanged. Pertinent negatives include no fatigue or weight loss. Risk factors include obesity. She has tried a PPI for the symptoms. Past procedures include an EGD.  Psoriatic Arthritis - treated by Rhe[facility]ology at [facility].  Started with psoriasis about 4 years ago.  Now being treated with [place].  Most affected joints are fingers, wrists and knees.  She has had good benefit and is satisfied with her response. Tingling in left wrist - this wakes her from sleep.  She is able to move her wrist around and it will resolve.  She denies injury or weakness.  She tried a splint without much benefit.   BPPV - she has intermittent episodes of vertigo lasting a few minutes and triggered by stress.    Review of Systems  Constitutional: Negative for chills, fatigue, fever, unexpected weight change and weight loss.  HENT: Negative for trouble swallowing.   Eyes: Negative for visual disturbance.  Respiratory:  Positive for shortness of breath (due to deconditioning). Negative for cough, chest tightness and wheezing.   Cardiovascular: Positive for  palpitations. Negative for chest pain and leg swelling.  Gastrointestinal: Negative for abdominal pain, constipation and diarrhea.       Reflux  Endocrine: Negative for polydipsia and polyuria.  Musculoskeletal: Positive for arthralgias, back pain and gait problem.  Skin: Negative for color change and rash.  Neurological: Positive for dizziness (intermittent vertigo from BPPV.). Negative for weakness.  Hematological: Negative for adenopathy.  Psychiatric/Behavioral: Negative for dysphoric mood and sleep disturbance.    Patient Active Problem List   Diagnosis Date Noted  . Guaiac + stool   . Acute superficial gastritis without hemorrhage   . Vitamin D deficiency 02/19/2017  . Long term current use of opiate analgesic 02/14/2017  . Long term prescription opiate use 02/14/2017  . Opiate use 02/14/2017  . Chronic low back pain (Primary Area of Pain) (L>R) 02/14/2017  . Chronic hip pain (Secondary Area of Pain) (L>R) 02/14/2017  . Chronic right shoulder pain (Tertiary Area of Pain) 02/14/2017  . Bilateral chronic knee pain (Fourth Area of Pain) (L>R) 02/14/2017  . Chronic neck pain 02/14/2017  . Chronic upper extremity pain 02/14/2017  . Encounter for screening for nutritional disorder 02/14/2017  . Chronic sacroiliac joint pain 02/14/2017  . Abscess of buttock 12/28/2016  . Personal history of colonic polyps   . Polyp of sigmoid colon   . Encounter for long-term (current) use of high-risk medication 08/28/2016  . Numbness and tingling in left hand 08/28/2016  . Chronic viral hepatitis B without coma and with delta agent ([place]) 11/19/2015  . Enthesitis 11/19/2015  . Facet joint disease of lumbosacral region ([place]) 11/19/2015  . Chronic midline low back pain without sciatica 11/09/2015  . Depression 11/09/2015  . Hypertension 11/09/2015  . Mild obesity 11/09/2015  . Psoriasis 11/09/2015  . Psoriatic arthritis ([place]) 11/09/2015  . Tendinitis of right foot 11/09/2015    Allergies   Allergen Reactions  . Bee Venom Swelling  . Contrast Media [Iodinated Diagnostic Agents] Shortness Of Breath  . Codeine Nausea And Vomiting       . Shellfish Allergy Nausea And Vomiting, Swelling and Rash         Past Surgical History:  Procedure Laterality Date  . COLONOSCOPY    . COLONOSCOPY WITH PROPOFOL N/A 10/09/2016   Procedure: COLONOSCOPY WITH PROPOFOL;  Surgeon: [name], MD;  Location: [facility];  Service: Endoscopy;  Laterality: N/A;  Diabetic - oral meds  . DILATION AND CURETTAGE OF UTERUS    . ESOPHAGOGASTRODUODENOSCOPY (EGD) WITH PROPOFOL N/A 01/03/2018   Procedure: ESOPHAGOGASTRODUODENOSCOPY (EGD) WITH PROPOFOL;  Surgeon: [name], MD;  Location: [facility];  Service: Endoscopy;  Laterality: N/A;  Diabetic - oral meds  . INDUCED ABORTION    . POLYPECTOMY  10/09/2016   Procedure: POLYPECTOMY;  Surgeon: [name], MD;  Location: [facility];  Service: Endoscopy;;  . TUBAL LIGATION      Social History   Tobacco Use  . Smoking status: Former Smoker    Packs/day: 1.00    Years: 18.00    Pack years: 18.00    Types: Cigarettes    Quit date: 06/12/2006    Years since quitting: 12.6  . Smokeless tobacco: Never Used  Substance Use Topics  . Alcohol use: No  . Drug use: No     Medication list has been reviewed and updated.  Current Meds  Medication Sig  .  Apremilast 30 MG TABS Take 1 tablet by mouth 2 (two) times daily. OTEZLA  . atorvastatin (LIPITOR) 80 MG tablet Take 80 mg by mouth daily.   . cyclobenzaprine (FLEXERIL) 10 MG tablet 10 mg 3 (three) times daily as needed. Restless Leg- Usually at Bedtime.  . dapagliflozin propanediol (FARXIGA) 10 MG TABS tablet Take by mouth.  . fluticasone (FLONASE) 50 MCG/ACT nasal spray Place 1 spray into the nose daily as needed.  . gabapentin (NEURONTIN) 300 MG capsule Take 300 mg by mouth 2 (two) times daily. Sciatic Nerve Pain- taking only at bedtime.  Marland Kitchen lisinopril-hydrochlorothiazide  (PRINZIDE,ZESTORETIC) 10-12.5 MG tablet once a day  . metoprolol succinate (TOPROL-XL) 100 MG 24 hr tablet Take 100 mg by mouth daily. Take with or immediately following a meal.  . omeprazole (PRILOSEC) 20 MG capsule Take 20 mg by mouth daily.    PHQ 2/9 Scores 01/31/2019 02/14/2017  PHQ - 2 Score 0 0    BP Readings from Last 3 Encounters:  01/31/19 128/78  01/03/18 110/73  12/26/17 119/80    Physical Exam Constitutional:      Appearance: Normal appearance. She is obese.  Neck:     Musculoskeletal: Normal range of motion.     Vascular: No carotid bruit.  Cardiovascular:     Rate and Rhythm: Normal rate and regular rhythm.     Pulses: Normal pulses.     Heart sounds: No murmur.  Pulmonary:     Effort: Pulmonary effort is normal.     Breath sounds: Normal breath sounds. No wheezing or rhonchi.  Musculoskeletal:     Right lower leg: No edema.     Left lower leg: No edema.     Comments: Tinel's and Phalen's positive on left - grip 5/5 Pulses normal  Lymphadenopathy:     Cervical: No cervical adenopathy.  Skin:    General: Skin is warm and dry.     Capillary Refill: Capillary refill takes less than 2 seconds.     Findings: No erythema or rash.  Neurological:     Mental Status: She is alert and oriented to person, place, and time.     Gait: Gait abnormal (gait unsteady).     Deep Tendon Reflexes:     Reflex Scores:      Bicep reflexes are 2+ on the right side and 2+ on the left side.      Patellar reflexes are 2+ on the right side and 2+ on the left side. Psychiatric:        Attention and Perception: Attention normal.        Mood and Affect: Mood normal.        Speech: Speech normal.        Cognition and Memory: Cognition normal.     Wt Readings from Last 3 Encounters:  01/31/19 254 lb (115.2 kg)  01/03/18 258 lb (117 kg)  12/26/17 257 lb (116.6 kg)    BP 128/78   Pulse 80   Ht 5\' 7"  (1.702 m)   Wt 254 lb (115.2 kg)   SpO2 98%   BMI 39.78 kg/m   Assessment  and Plan: 1. Essential hypertension Appears controlled on current medications ACE/HCTZ No hx of CAD with stress testing done in the past few years Continue current medications - Comprehensive metabolic panel  2. Type II diabetes mellitus with complication (H[facility]) Stopped metformin and started on Farxiga in the past year Pt does not check BS at home but did lose about 20 lbs  Will continue current medications and consider adding metformin again if needed Foot exam is normal Pt reminded to get annual eye exams Pt declines all vaccinations - Hemoglobin A1c  3. Carpal tunnel syndrome of left wrist Positive Tinel's and Phalen's signs with normal strength and pulses Avoid pressure while sleeping - refer if sx become persistent - TSH + free T4  4. Psoriatic arthritis ([place]) Followed by Rheumatology Sx well controlled on Otezla  5. GERD without esophagitis Symptoms well controlled on daily PPI No red flag signs such as weight loss, n/v, melena Will continue omeprazole 20 mg daily.  6. Morbid obesity ([place]) Pt encouraged to continue efforts at weight loss  7. Encounter for screening mammogram for breast cancer Pt will schedule at [facility]; Future  8. Benign paroxysmal positional vertigo due to bilateral vestibular disorder Chronic, intermittent sx  9. Chronic bilateral low back pain with left-sided sciatica Managed with gabapentin, nsaids and flexeril  Request records from [facility].  Partially dictated using Editor, commissioning. Any errors are unintentional.  Halina Maidens, MD [facility]  01/31/2019

## 2019-02-01 LAB — COMPREHENSIVE METABOLIC PANEL
ALT: 20 IU/L (ref 0–32)
AST: 19 IU/L (ref 0–40)
Albumin/Globulin Ratio: 2.2 (ref 1.2–2.2)
Albumin: 4.6 g/dL (ref 3.8–4.8)
Alkaline Phosphatase: 188 IU/L — ABNORMAL HIGH (ref 39–117)
BUN/Creatinine Ratio: 13 (ref 12–28)
BUN: 10 mg/dL (ref 8–27)
Bilirubin Total: 0.5 mg/dL (ref 0.0–1.2)
CO2: 24 mmol/L (ref 20–29)
Calcium: 10.3 mg/dL (ref 8.7–10.3)
Chloride: 99 mmol/L (ref 96–106)
Creatinine, Ser: 0.79 mg/dL (ref 0.57–1.00)
GFR calc Af Amer: 90 mL/min/{1.73_m2} (ref >59)
GFR calc non Af Amer: 78 mL/min/{1.73_m2} (ref >59)
Globulin, Total: 2.1 g/dL (ref 1.5–4.5)
Glucose: 99 mg/dL (ref 65–99)
Potassium: 5 mmol/L (ref 3.5–5.2)
Sodium: 139 mmol/L (ref 134–144)
Total Protein: 6.7 g/dL (ref 6.0–8.5)

## 2019-02-01 LAB — TSH+FREE T4
Free T4: 1.2 ng/dL (ref 0.82–1.77)
TSH: 1.97 u[IU]/mL (ref 0.450–4.500)

## 2019-02-01 LAB — HEMOGLOBIN A1C
Est. average glucose Bld gHb Est-mCnc: 137 mg/dL
Hgb A1c MFr Bld: 6.4 % — ABNORMAL HIGH (ref 4.8–5.6)

## 2019-02-25 ENCOUNTER — Other Ambulatory Visit: Payer: Self-pay

## 2019-03-17 ENCOUNTER — Telehealth: Payer: Self-pay

## 2019-03-17 NOTE — Telephone Encounter (Signed)
I do not have any notes from [facility] to follow up on these issues.  I thought that the metoprolol was for HTN, and since I can't see any stress test reports, I can not make that decision. I recommend for now to continue medication, follow up with me as planned and wait for records to decide if cardiology is needed at this time.

## 2019-03-17 NOTE — Telephone Encounter (Signed)
Asked at [place] if we would refer to [facility] offices for Cardiology instead of traveling to [place]. Advised to wait for call from Cardiology and if we can not do this we will call her to inform today. Patient sees them for Metoprolol and states she has no more issues with irregular heart rate and feels she should not even see them but I advised her they would need to keep filling the RX unless discuss at [place] for PCP to agree to take over.

## 2019-03-18 NOTE — Telephone Encounter (Signed)
Advised and she will discuss at next OV

## 2019-03-26 ENCOUNTER — Ambulatory Visit
Admission: RE | Admit: 2019-03-26 | Discharge: 2019-03-26 | Disposition: A | Discharge status: Home / Self Care | Payer: Medicare Other | Source: Ambulatory Visit | Attending: Internal Medicine | Admitting: Internal Medicine

## 2019-03-26 ENCOUNTER — Other Ambulatory Visit: Payer: Self-pay

## 2019-03-26 DIAGNOSIS — Z1231 Encounter for screening mammogram for malignant neoplasm of breast: Secondary | ICD-10-CM | POA: Insufficient documentation

## 2019-04-21 ENCOUNTER — Other Ambulatory Visit: Payer: Self-pay | Admitting: Internal Medicine

## 2019-06-02 ENCOUNTER — Ambulatory Visit: Payer: Medicare Other | Admitting: Internal Medicine

## 2019-06-04 ENCOUNTER — Ambulatory Visit
Admission: EM | Admit: 2019-06-04 | Discharge: 2019-06-04 | Disposition: A | Discharge status: Home / Self Care | Payer: Medicare Other | Attending: Urgent Care | Admitting: Urgent Care

## 2019-06-04 ENCOUNTER — Other Ambulatory Visit: Payer: Self-pay

## 2019-06-04 DIAGNOSIS — J019 Acute sinusitis, unspecified: Secondary | ICD-10-CM

## 2019-06-04 MED ORDER — AMOXICILLIN-POT CLAVULANATE 875-125 MG PO TABS: 1.0000 | ORAL_TABLET | Freq: Two times a day (BID) | ORAL | 0 refills | Status: AC

## 2019-06-04 NOTE — Discharge Instructions (Addendum)
It was very nice seeing you today in clinic. Thank you for entrusting me with your care.   Rest and Stay HYDRATED. Water and electrolyte containing beverages (Gatorade, Pedialyte) are best to prevent dehydration and electrolyte abnormalities. Add Flonase for congestion. May use Tylenol and/or Ibuprofen as needed for pain/fever.   Make arrangements to follow up with your regular doctor in 1 week for re-evaluation if not improving. If your symptoms/condition worsens, please seek follow up care either here or in the ER. Please remember, our [facility] providers are "right here with you" when you need [place].   Again, it was my pleasure to take care of you today. Thank you for choosing our clinic. I hope that you start to feel better quickly.   Honor Loh, MSN, APRN, FNP-C, CEN Advanced Practice Provider [facility]

## 2019-06-04 NOTE — ED Triage Notes (Signed)
Patient states that she has been having sinus pain and pressure, congestion, nasal drip x 2-3 weeks that has been constant, worse in the morning.

## 2019-06-05 NOTE — ED Provider Notes (Signed)
[place], [place]   Name: Diane Morris DOB: [birth year] MRN: [id number] CSN: [id number] PCP: Glean Hess, MD  Arrival date and time:  06/04/19 [phone]  Chief Complaint:  Sinusitis   NOTE: Prior to seeing the patient today, I have reviewed the triage nursing documentation and vital signs. Clinical staff has updated patient's PMH/PSHx, current medication list, and drug allergies/intolerances to ensure comprehensive history available to assist in medical decision making.   History:   HPI: Diane Morris is a 68 y.o. female who presents today with complaints of congestion, paranasal sinus tenderness, PND, and LEFT ear pain that started approximately 2 weeks ago. Symptom constellation has been consistent (non-worsening) since onset. Patient denies fevers. She denies any cough, shortness of breath, or wheezing. PMH (+) for seasonal allergies; takes cetirizine and fluticasone. Patient has had intermittent nausea, however denies vomiting, diarrhea, or abdominal pain. She describes a pharyngeal globus sensation that has been persistent since the onset of her symptoms. She is eating and drinking well. Patient denies any perceived alterations to her sense of taste or smell. Patient denies being in close contact with anyone known to be ill; no one else is her home has experienced a similar symptom constellation. She has never been tested for SARS-CoV-2 (novel coronavirus) in the past and does not wish to be tested today per staff report. Patient has not been vaccinated for influenza this season.  Past Medical History:  Diagnosis Date   BPPV (benign paroxysmal positional vertigo)    Depression    Depression 11/09/2015   Diabetes mellitus type 2, noninsulin dependent (HCC)    Dysrhythmia    tachycardia - controlled with metoprolol   GERD (gastroesophageal reflux disease)    Hepatitis A    as teenager. treated   Hepatitis B 12/2015   positive test, patient states she does not have hepatitis  B, last panel was negative which was the 3rd panel   Hx of blood clots 2016   left calf   Hypercholesteremia    Hypertension    Long term current use of opiate analgesic 02/14/2017   Long term prescription opiate use 02/14/2017   Opiate use 02/14/2017   Psoriatic arthritis ([place])    Sciatica of left side    Vertigo    when anxious    Past Surgical History:  Procedure Laterality Date   COLONOSCOPY     COLONOSCOPY WITH PROPOFOL N/A 10/09/2016   Procedure: COLONOSCOPY WITH PROPOFOL;  Surgeon: [name], MD;  Location: [place];  Service: Endoscopy;  Laterality: N/A;  Diabetic - oral meds   DILATION AND CURETTAGE OF UTERUS     ESOPHAGOGASTRODUODENOSCOPY (EGD) WITH PROPOFOL N/A 01/03/2018   Procedure: ESOPHAGOGASTRODUODENOSCOPY (EGD) WITH PROPOFOL;  Surgeon: [name], MD;  Location: [place];  Service: Endoscopy;  Laterality: N/A;  Diabetic - oral meds   INDUCED ABORTION     POLYPECTOMY  10/09/2016   Procedure: POLYPECTOMY;  Surgeon: [name], MD;  Location: [place];  Service: Endoscopy;;   TUBAL LIGATION      Family History  Problem Relation Age of Onset   Congestive Heart Failure Mother    Emphysema Mother    Heart disease Father    Hypertension Sister    Cancer Brother    Hypertension Brother    Breast cancer Neg Hx     Social History   Tobacco Use   Smoking status: Former Smoker    Packs/day: 1.00    Years: 18.00    Pack years: 18.00  Types: Cigarettes    Quit date: 06/12/2006    Years since quitting: 12.9   Smokeless tobacco: Never Used  Substance Use Topics   Alcohol use: No   Drug use: No    Patient Active Problem List   Diagnosis Date Noted   Essential hypertension 01/31/2019   GERD without esophagitis 01/31/2019   Type II diabetes mellitus with complication ([place]) 0000000   Morbid obesity ([place]) 01/31/2019   Benign paroxysmal positional vertigo due to bilateral vestibular disorder  01/31/2019   History of gastritis    Vitamin D deficiency 02/19/2017   Chronic low back pain (Primary Area of Pain) (L>R) 02/14/2017   Chronic hip pain (Secondary Area of Pain) (L>R) 02/14/2017   Chronic right shoulder pain (Tertiary Area of Pain) 02/14/2017   Bilateral chronic knee pain (Fourth Area of Pain) (L>R) 02/14/2017   Chronic neck pain 02/14/2017   Chronic upper extremity pain 02/14/2017   Chronic sacroiliac joint pain 02/14/2017   Adenomatous polyp of colon    CTS (carpal tunnel syndrome) 08/28/2016   Hepatitis B surface antigen positive 11/19/2015   Enthesitis 11/19/2015   Facet joint disease of lumbosacral region ([place]) 11/19/2015   Chronic bilateral low back pain with left-sided sciatica 11/09/2015   Psoriasis 11/09/2015   Psoriatic arthritis ([place]) 11/09/2015   Tendinitis of right foot 11/09/2015    Home Medications:    Current Meds  Medication Sig   Apremilast 30 MG TABS Take 1 tablet by mouth 2 (two) times daily. OTEZLA   atorvastatin (LIPITOR) 80 MG tablet Take 80 mg by mouth daily.    cyclobenzaprine (FLEXERIL) 10 MG tablet 10 mg 3 (three) times daily as needed. Restless Leg- Usually at Bedtime.   dapagliflozin propanediol (FARXIGA) 10 MG TABS tablet Take by mouth.   gabapentin (NEURONTIN) 300 MG capsule Take 300 mg by mouth 2 (two) times daily. Sciatic Nerve Pain- taking only at bedtime.   lisinopril-hydrochlorothiazide (ZESTORETIC) 10-12.5 MG tablet TAKE 1 TABLET BY MOUTH EVERY MORNING FOR BLOOD PRESSURE   metoprolol succinate (TOPROL-XL) 100 MG 24 hr tablet Take 100 mg by mouth daily. Take with or immediately following a meal.   omeprazole (PRILOSEC) 20 MG capsule Take 20 mg by mouth daily.    Allergies:   Bee venom, Contrast media [iodinated diagnostic agents], Codeine, and Shellfish allergy  Review of Systems (ROS): Review of Systems  Constitutional: Negative for fatigue and fever.  HENT: Positive for congestion, ear pain,  postnasal drip, sinus pressure and sinus pain. Negative for rhinorrhea, sneezing and sore throat.        (+) globus  Eyes: Positive for itching. Negative for pain, discharge and redness.  Respiratory: Negative for cough, chest tightness and shortness of breath.   Cardiovascular: Negative for chest pain and palpitations.  Gastrointestinal: Positive for nausea. Negative for abdominal pain, diarrhea and vomiting.  Musculoskeletal: Negative for arthralgias, back pain, myalgias and neck pain.  Skin: Negative for color change, pallor and rash.  Allergic/Immunologic: Positive for environmental allergies (seasonal).  Neurological: Negative for dizziness, syncope, weakness and headaches.  Hematological: Negative for adenopathy.     Vital Signs: Today's Vitals   06/04/19 1027 06/04/19 1028 06/04/19 1030  BP:   115/83  Pulse:   80  Resp:   16  Temp:   98.3 F (36.8 C)  TempSrc:   Oral  SpO2:   98%  Weight:  250 lb (113.4 kg)   Height:  5\' 7"  (1.702 m)   PainSc: 4  Physical Exam: Physical Exam  Constitutional: She is oriented to person, place, and time and well-developed, well-nourished, and in no distress.  HENT:  Head: Normocephalic and atraumatic.  Right Ear: Tympanic membrane normal.  Left Ear: There is tenderness. Tympanic membrane is erythematous. A middle ear effusion (mild suppurative) is present.  Nose: Mucosal edema, rhinorrhea and sinus tenderness present.  Mouth/Throat: Uvula is midline and mucous membranes are normal. Posterior oropharyngeal erythema (+) PND present. No oropharyngeal exudate or posterior oropharyngeal edema.  Eyes: Pupils are equal, round, and reactive to light.  Cardiovascular: Normal rate, regular rhythm, normal heart sounds and intact distal pulses.  Pulmonary/Chest: Effort normal and breath sounds normal.  Lymphadenopathy:       Head (left side): Submandibular adenopathy present.  Neurological: She is alert and oriented to person, place, and time.  Gait normal.  Skin: Skin is warm and dry. No rash noted. She is not diaphoretic.  Psychiatric: Mood, memory, affect and judgment normal.  Nursing note and vitals reviewed.   Urgent Care Treatments / Results:   No orders of the defined types were placed in this encounter.   LABS: PLEASE NOTE: all labs that were ordered this encounter are listed, however only abnormal results are displayed. Labs Reviewed - No data to display  EKG: -None  RADIOLOGY: No results found.  PROCEDURES: Procedures  MEDICATIONS RECEIVED THIS VISIT: Medications - No data to display  PERTINENT CLINICAL COURSE NOTES/UPDATES:   Initial Impression / Assessment and Plan / Urgent Care Course:  Pertinent labs & imaging results that were available during my care of the patient were personally reviewed by me and considered in my medical decision making (see lab/imaging section of note for values and interpretations).  Diane Morris is a 68 y.o. female who presents to [facility] today with complaints of Sinusitis   Patient overall well appearing and in no acute distress today in clinic. Presenting symptoms (see HPI) and exam as documented above. Symptoms persistent x 2 weeks. No fevers. No SOB, tachycardia, or hypoxia. PMH (+) allergies; uses allergy medication and nasal sprays. Patient denies being in close contact with anyone known to be ill. Declined SARS-CoV-2 testing. Given the chronicity of her symptoms, doubt SARS-CoV-2. Exam consistent with acute sinusitis. NKDA. Will proceed with treatment using a 7 day course of amoxicillin-clavulanate. Discussed supportive care measures at home during acute phase of illness. Patient to rest as much as possible. She was encouraged to ensure adequate hydration (water and ORS) to prevent dehydration and electrolyte derangements. Patient may use APAP and/or IBU on an as needed basis for pain/fever.   Discussed follow up with primary care physician in 1 week for  re-evaluation. I have reviewed the follow up and strict return precautions for any new or worsening symptoms. Patient is aware of symptoms that would be deemed urgent/emergent, and would thus require further evaluation either here or in the emergency department. At the time of discharge, she verbalized understanding and consent with the discharge plan as it was reviewed with her. All questions were fielded by provider and/or clinic staff prior to patient discharge.    Final Clinical Impressions / Urgent Care Diagnoses:   Final diagnoses:  Acute non-recurrent sinusitis, unspecified location    New Prescriptions:   Controlled Substance Registry consulted? Not Applicable  Meds ordered this encounter  Medications   amoxicillin-clavulanate (AUGMENTIN) 875-125 MG tablet    Sig: Take 1 tablet by mouth 2 (two) times daily for 7 days.    Dispense:  14  tablet    Refill:  0    Recommended Follow up Care:  Patient encouraged to follow up with the following provider within the specified time frame, or sooner as dictated by the severity of her symptoms. As always, she was instructed that for any urgent/emergent care needs, she should seek care either here or in the emergency department for more immediate evaluation.  Follow-up Information    Glean Hess, MD In 1 week.   Specialty: Internal Medicine Why: General reassessment of symptoms if not improving Contact information: [address] [place] [phone]         NOTE: This note was prepared using Dragon dictation software along with smaller phrase technology. Despite my best ability to proofread, there is the potential that transcriptional errors may still occur from this process, and are completely unintentional.    Karen Kitchens, NP 06/05/19 2222

## 2019-06-30 ENCOUNTER — Ambulatory Visit: Payer: Medicare Other | Admitting: Internal Medicine

## 2019-07-04 ENCOUNTER — Encounter: Payer: Self-pay | Admitting: Internal Medicine

## 2019-07-04 ENCOUNTER — Other Ambulatory Visit: Payer: Self-pay

## 2019-07-04 ENCOUNTER — Ambulatory Visit (INDEPENDENT_AMBULATORY_CARE_PROVIDER_SITE_OTHER): Payer: Medicare Other | Admitting: Internal Medicine

## 2019-07-04 VITALS — BP 124/78 | HR 94 | Temp 97.8°F | Ht 67.0 in | Wt 253.0 lb

## 2019-07-04 DIAGNOSIS — Z1159 Encounter for screening for other viral diseases: Secondary | ICD-10-CM | POA: Diagnosis not present

## 2019-07-04 DIAGNOSIS — H8113 Benign paroxysmal vertigo, bilateral: Secondary | ICD-10-CM

## 2019-07-04 DIAGNOSIS — I10 Essential (primary) hypertension: Secondary | ICD-10-CM | POA: Diagnosis not present

## 2019-07-04 DIAGNOSIS — E118 Type 2 diabetes mellitus with unspecified complications: Secondary | ICD-10-CM

## 2019-07-04 NOTE — Progress Notes (Signed)
Date:  07/04/2019   Name:  Diane Morris   DOB:  02-05-1951   MRN:  [id number]   Chief Complaint: Diabetes (4 month follow up.) and Hypertension  Diabetes She presents for her follow-up diabetic visit. She has type 2 diabetes mellitus. Her disease course has been stable. Hypoglycemia symptoms include dizziness. Pertinent negatives for hypoglycemia include no headaches. Pertinent negatives for diabetes include no chest pain. Risk factors for coronary artery disease include diabetes mellitus. Current diabetic treatment includes oral agent (monotherapy) (farxiga). Her weight is stable. There is no compliance with monitoring of blood glucose. An ACE inhibitor/angiotensin II receptor blocker is being taken.  Hypertension This is a chronic problem. The problem is controlled. Pertinent negatives include no chest pain, headaches, palpitations or shortness of breath. Past treatments include ACE inhibitors, diuretics and beta blockers. The current treatment provides significant improvement.  Dizziness This is a recurrent problem. The problem occurs intermittently. The problem has been unchanged. Associated symptoms include arthralgias. Pertinent negatives include no abdominal pain, chest pain, chills, coughing, fever, headaches or rash. The symptoms are aggravated by twisting and bending (and wide open spaces).    Lab Results  Component Value Date   CREATININE 0.79 01/31/2019   BUN 10 01/31/2019   NA 139 01/31/2019   K 5.0 01/31/2019   CL 99 01/31/2019   CO2 24 01/31/2019   Lab Results  Component Value Date   CHOL 158 08/13/2018   HDL 34 (A) 08/13/2018   LDLCALC 88 08/13/2018   TRIG 179 (A) 08/13/2018   Lab Results  Component Value Date   TSH 1.970 01/31/2019   Lab Results  Component Value Date   HGBA1C 6.4 (H) 01/31/2019     Review of Systems  Constitutional: Negative for chills, fever and unexpected weight change.  HENT: Negative for ear discharge, ear pain and trouble  swallowing.   Eyes: Negative for visual disturbance.  Respiratory: Negative for cough, chest tightness, shortness of breath and wheezing.   Cardiovascular: Negative for chest pain and palpitations.  Gastrointestinal: Negative for abdominal pain, constipation and diarrhea.  Musculoskeletal: Positive for arthralgias and back pain.  Skin: Negative for color change and rash.  Neurological: Positive for dizziness. Negative for headaches.  Psychiatric/Behavioral: Negative for dysphoric mood and sleep disturbance.    Patient Active Problem List   Diagnosis Date Noted  . Essential hypertension 01/31/2019  . GERD without esophagitis 01/31/2019  . Type II diabetes mellitus with complication ([place]) 0000000  . Morbid obesity ([place]) 01/31/2019  . Benign paroxysmal positional vertigo due to bilateral vestibular disorder 01/31/2019  . History of gastritis   . Vitamin D deficiency 02/19/2017  . Chronic low back pain (Primary Area of Pain) (L>R) 02/14/2017  . Chronic hip pain (Secondary Area of Pain) (L>R) 02/14/2017  . Chronic right shoulder pain (Tertiary Area of Pain) 02/14/2017  . Bilateral chronic knee pain (Fourth Area of Pain) (L>R) 02/14/2017  . Chronic neck pain 02/14/2017  . Chronic upper extremity pain 02/14/2017  . Chronic sacroiliac joint pain 02/14/2017  . Adenomatous polyp of colon   . CTS (carpal tunnel syndrome) 08/28/2016  . Hepatitis B surface antigen positive 11/19/2015  . Enthesitis 11/19/2015  . Facet joint disease of lumbosacral region ([place]) 11/19/2015  . Chronic bilateral low back pain with left-sided sciatica 11/09/2015  . Psoriasis 11/09/2015  . Psoriatic arthritis ([place]) 11/09/2015  . Tendinitis of right foot 11/09/2015    Allergies  Allergen Reactions  . Bee Venom Swelling  . Contrast Media [Iodinated  Diagnostic Agents] Shortness Of Breath  . Codeine Nausea And Vomiting       . Shellfish Allergy Nausea And Vomiting, Swelling and Rash         Past Surgical  History:  Procedure Laterality Date  . COLONOSCOPY    . COLONOSCOPY WITH PROPOFOL N/A 10/09/2016   Procedure: COLONOSCOPY WITH PROPOFOL;  Surgeon: [name], MD;  Location: [facility];  Service: Endoscopy;  Laterality: N/A;  Diabetic - oral meds  . DILATION AND CURETTAGE OF UTERUS    . ESOPHAGOGASTRODUODENOSCOPY (EGD) WITH PROPOFOL N/A 01/03/2018   Procedure: ESOPHAGOGASTRODUODENOSCOPY (EGD) WITH PROPOFOL;  Surgeon: [name], MD;  Location: [place] [facility];  Service: Endoscopy;  Laterality: N/A;  Diabetic - oral meds  . INDUCED ABORTION    . POLYPECTOMY  10/09/2016   Procedure: POLYPECTOMY;  Surgeon: [name], MD;  Location: [facility];  Service: Endoscopy;;  . TUBAL LIGATION      Social History   Tobacco Use  . Smoking status: Former Smoker    Packs/day: 1.00    Years: 18.00    Pack years: 18.00    Types: Cigarettes    Quit date: 06/12/2006    Years since quitting: 13.0  . Smokeless tobacco: Never Used  Substance Use Topics  . Alcohol use: No  . Drug use: No     Medication list has been reviewed and updated.  Current Meds  Medication Sig  . Apremilast 30 MG TABS Take 1 tablet by mouth 2 (two) times daily. OTEZLA  . atorvastatin (LIPITOR) 80 MG tablet Take 80 mg by mouth daily.   . cyclobenzaprine (FLEXERIL) 10 MG tablet 10 mg 3 (three) times daily as needed. Restless Leg- Usually at Bedtime.  . dapagliflozin propanediol (FARXIGA) 10 MG TABS tablet Take by mouth.  . gabapentin (NEURONTIN) 300 MG capsule Take 300 mg by mouth daily. Sciatic Nerve Pain- taking only at bedtime.  Marland Kitchen lisinopril-hydrochlorothiazide (ZESTORETIC) 10-12.5 MG tablet TAKE 1 TABLET BY MOUTH EVERY MORNING FOR BLOOD PRESSURE  . metoprolol succinate (TOPROL-XL) 100 MG 24 hr tablet Take 100 mg by mouth daily. Take with or immediately following a meal.  . omeprazole (PRILOSEC) 20 MG capsule Take 20 mg by mouth daily.    PHQ 2/9 Scores 07/04/2019 01/31/2019 02/14/2017  PHQ - 2 Score 2  0 0  PHQ- 9 Score 2 - -    BP Readings from Last 3 Encounters:  07/04/19 124/78  06/04/19 115/83  01/31/19 128/78    Physical Exam Vitals and nursing note reviewed.  Constitutional:      General: She is not in acute distress.    Appearance: She is well-developed. She is obese.  HENT:     Head: Normocephalic and atraumatic.  Cardiovascular:     Rate and Rhythm: Normal rate and regular rhythm.     Heart sounds: No murmur.  Pulmonary:     Effort: Pulmonary effort is normal. No respiratory distress.     Breath sounds: No wheezing or rhonchi.  Musculoskeletal:     Cervical back: Normal range of motion.     Right lower leg: No edema.     Left lower leg: No edema.  Lymphadenopathy:     Cervical: No cervical adenopathy.  Skin:    General: Skin is warm and dry.     Capillary Refill: Capillary refill takes less than 2 seconds.     Findings: No rash.  Neurological:     General: No focal deficit present.  Mental Status: She is alert and oriented to person, place, and time.  Psychiatric:        Behavior: Behavior normal.        Thought Content: Thought content normal.     Wt Readings from Last 3 Encounters:  07/04/19 253 lb (114.8 kg)  06/04/19 250 lb (113.4 kg)  01/31/19 254 lb (115.2 kg)    BP 124/78   Pulse 94   Temp 97.8 F (36.6 C) (Oral)   Ht 5\' 7"  (1.702 m)   Wt 253 lb (114.8 kg)   SpO2 97%   BMI 39.63 kg/m   Assessment and Plan: 1. Essential hypertension Clinically stable exam with well controlled BP on lisinopril and metoprolol. Tolerating medications without side effects at this time. Pt to continue current regimen and low sodium diet; benefits of regular exercise as able discussed.  2. Type II diabetes mellitus with complication ([place]) Clinically stable by exam and report without s/s of hypoglycemia. DM complicated by HTN, lipids. Tolerating medications farxiga well without side effects or other concerns. - Hemoglobin A1c  3. Benign paroxysmal  positional vertigo due to bilateral vestibular disorder Chronic intermittent symptoms  4. Need for hepatitis C screening test - Hepatitis C antibody   Partially dictated using Editor, commissioning. Any errors are unintentional.  Halina Maidens, MD [facility]  07/04/2019

## 2019-07-05 LAB — HEPATITIS C ANTIBODY: Hep C Virus Ab: <0.1 s/co ratio (ref 0.0–0.9)

## 2019-07-05 LAB — HEMOGLOBIN A1C
Est. average glucose Bld gHb Est-mCnc: 143 mg/dL
Hgb A1c MFr Bld: 6.6 % — ABNORMAL HIGH (ref 4.8–5.6)

## 2019-08-06 ENCOUNTER — Other Ambulatory Visit: Payer: Self-pay

## 2019-08-06 ENCOUNTER — Encounter: Payer: Self-pay | Admitting: Internal Medicine

## 2019-08-06 ENCOUNTER — Ambulatory Visit (INDEPENDENT_AMBULATORY_CARE_PROVIDER_SITE_OTHER): Payer: Medicare Other | Admitting: Internal Medicine

## 2019-08-06 VITALS — BP 118/76 | HR 77 | Temp 98.2°F | Ht 67.0 in | Wt 254.0 lb

## 2019-08-06 DIAGNOSIS — I10 Essential (primary) hypertension: Secondary | ICD-10-CM

## 2019-08-06 DIAGNOSIS — K219 Gastro-esophageal reflux disease without esophagitis: Secondary | ICD-10-CM | POA: Diagnosis not present

## 2019-08-06 DIAGNOSIS — E118 Type 2 diabetes mellitus with unspecified complications: Secondary | ICD-10-CM

## 2019-08-06 DIAGNOSIS — H8113 Benign paroxysmal vertigo, bilateral: Secondary | ICD-10-CM

## 2019-08-06 DIAGNOSIS — Z1231 Encounter for screening mammogram for malignant neoplasm of breast: Secondary | ICD-10-CM

## 2019-08-06 DIAGNOSIS — Z0000 Encounter for general adult medical examination without abnormal findings: Secondary | ICD-10-CM

## 2019-08-06 DIAGNOSIS — E1169 Type 2 diabetes mellitus with other specified complication: Secondary | ICD-10-CM | POA: Diagnosis not present

## 2019-08-06 DIAGNOSIS — E785 Hyperlipidemia, unspecified: Secondary | ICD-10-CM

## 2019-08-06 LAB — POCT URINALYSIS DIPSTICK
Bilirubin, UA: NEGATIVE
Blood, UA: NEGATIVE
Glucose, UA: POSITIVE — AB
Ketones, UA: NEGATIVE
Leukocytes, UA: NEGATIVE
Nitrite, UA: NEGATIVE
Protein, UA: NEGATIVE
Spec Grav, UA: 1.01 (ref 1.010–1.025)
Urobilinogen, UA: 0.2 E.U./dL
pH, UA: 5 (ref 5.0–8.0)

## 2019-08-06 NOTE — Patient Instructions (Signed)
Covid Vaccine locations: [employer] Dept. [place]  [email or web address]

## 2019-08-06 NOTE — Progress Notes (Signed)
Date:  08/06/2019   Name:  Diane Morris   DOB:  12-22-1950   MRN:  [id number]   Chief Complaint: Annual Exam (Breast Exam. No pap- aged out.) Kris Kealey is a 69 y.o. female who presents today for her Complete Annual Exam. She feels fairly well. She reports exercising some. She reports she is sleeping fairly well. She denies breast issues.  She declines all vaccines. She is still plagued by BPPV almost daily. Meclizine no longer provided benefit.  Mammogram 03/2019 Colonoscopy  2018  There is no immunization history on file for this patient.  Hypertension This is a chronic problem. The problem is controlled (normal at home). Pertinent negatives include no chest pain, headaches, palpitations (intermittent tachycardia) or shortness of breath. Past treatments include diuretics, beta blockers and ACE inhibitors. The current treatment provides significant improvement.  Diabetes She presents for her follow-up diabetic visit. She has type 2 diabetes mellitus. Hypoglycemia symptoms include dizziness. Pertinent negatives for hypoglycemia include no headaches, nervousness/anxiousness or tremors. Pertinent negatives for diabetes include no chest pain, no fatigue, no polydipsia and no polyuria. Current diabetic treatment includes oral agent (monotherapy) (farxiga). Her weight is stable. There is no compliance with monitoring of blood glucose. An ACE inhibitor/angiotensin II receptor blocker is being taken. Eye exam is not current.  Hyperlipidemia The problem is controlled. Pertinent negatives include no chest pain or shortness of breath. Current antihyperlipidemic treatment includes statins. The current treatment provides significant improvement of lipids.  Gastroesophageal Reflux She complains of heartburn. She reports no abdominal pain, no chest pain, no coughing or no wheezing. This is a recurrent problem. Pertinent negatives include no fatigue. She has tried a PPI for the symptoms. The  treatment provided significant relief.    Lab Results  Component Value Date   CREATININE 0.79 01/31/2019   BUN 10 01/31/2019   NA 139 01/31/2019   K 5.0 01/31/2019   CL 99 01/31/2019   CO2 24 01/31/2019   Lab Results  Component Value Date   CHOL 158 08/13/2018   HDL 34 (A) 08/13/2018   LDLCALC 88 08/13/2018   TRIG 179 (A) 08/13/2018   Lab Results  Component Value Date   TSH 1.970 01/31/2019   Lab Results  Component Value Date   HGBA1C 6.6 (H) 07/04/2019     Review of Systems  Constitutional: Negative for chills, fatigue and fever.  HENT: Positive for tinnitus (intermittent). Negative for congestion, hearing loss, trouble swallowing and voice change.        Smells exhaust when it is not present about three times a year  Eyes: Negative for visual disturbance.  Respiratory: Negative for cough, chest tightness, shortness of breath and wheezing.   Cardiovascular: Negative for chest pain, palpitations (intermittent tachycardia) and leg swelling.  Gastrointestinal: Positive for heartburn. Negative for abdominal pain, constipation, diarrhea and vomiting.  Endocrine: Negative for polydipsia and polyuria.  Genitourinary: Negative for dysuria, frequency, genital sores, vaginal bleeding and vaginal discharge.  Musculoskeletal: Negative for arthralgias, gait problem and joint swelling.  Skin: Negative for color change and rash.  Neurological: Positive for dizziness. Negative for tremors, light-headedness and headaches.  Hematological: Negative for adenopathy. Does not bruise/bleed easily.  Psychiatric/Behavioral: Negative for dysphoric mood and sleep disturbance. The patient is not nervous/anxious.     Patient Active Problem List   Diagnosis Date Noted  . Hyperlipidemia associated with type 2 diabetes mellitus ([place]) 08/06/2019  . Essential hypertension 01/31/2019  . GERD without esophagitis 01/31/2019  . Type II diabetes mellitus  with complication ([place]) 0000000  . Morbid  obesity ([place]) 01/31/2019  . Benign paroxysmal positional vertigo due to bilateral vestibular disorder 01/31/2019  . History of gastritis   . Vitamin D deficiency 02/19/2017  . Chronic low back pain (Primary Area of Pain) (L>R) 02/14/2017  . Chronic hip pain (Secondary Area of Pain) (L>R) 02/14/2017  . Chronic right shoulder pain (Tertiary Area of Pain) 02/14/2017  . Bilateral chronic knee pain (Fourth Area of Pain) (L>R) 02/14/2017  . Chronic neck pain 02/14/2017  . Chronic upper extremity pain 02/14/2017  . Chronic sacroiliac joint pain 02/14/2017  . Adenomatous polyp of colon   . CTS (carpal tunnel syndrome) 08/28/2016  . Hepatitis B surface antigen positive 11/19/2015  . Enthesitis 11/19/2015  . Facet joint disease of lumbosacral region ([place]) 11/19/2015  . Chronic bilateral low back pain with left-sided sciatica 11/09/2015  . Psoriasis 11/09/2015  . Psoriatic arthritis ([place]) 11/09/2015  . Tendinitis of right foot 11/09/2015    Allergies  Allergen Reactions  . Bee Venom Swelling  . Contrast Media [Iodinated Diagnostic Agents] Shortness Of Breath  . Codeine Nausea And Vomiting       . Shellfish Allergy Nausea And Vomiting, Swelling and Rash       . Sodium Clavulanate Diarrhea    Past Surgical History:  Procedure Laterality Date  . COLONOSCOPY    . COLONOSCOPY WITH PROPOFOL N/A 10/09/2016   Procedure: COLONOSCOPY WITH PROPOFOL;  Surgeon: [name], MD;  Location: [facility];  Service: Endoscopy;  Laterality: N/A;  Diabetic - oral meds  . DILATION AND CURETTAGE OF UTERUS    . ESOPHAGOGASTRODUODENOSCOPY (EGD) WITH PROPOFOL N/A 01/03/2018   Procedure: ESOPHAGOGASTRODUODENOSCOPY (EGD) WITH PROPOFOL;  Surgeon: [name], MD;  Location: [place];  Service: Endoscopy;  Laterality: N/A;  Diabetic - oral meds  . INDUCED ABORTION    . POLYPECTOMY  10/09/2016   Procedure: POLYPECTOMY;  Surgeon: [name], MD;  Location: [facility];  Service:  Endoscopy;;  . TUBAL LIGATION      Social History   Tobacco Use  . Smoking status: Former Smoker    Packs/day: 1.00    Years: 18.00    Pack years: 18.00    Types: Cigarettes    Quit date: 06/12/2006    Years since quitting: 13.1  . Smokeless tobacco: Never Used  Substance Use Topics  . Alcohol use: No  . Drug use: No     Medication list has been reviewed and updated.  Current Meds  Medication Sig  . Apremilast 30 MG TABS Take 1 tablet by mouth 2 (two) times daily. OTEZLA  . atorvastatin (LIPITOR) 80 MG tablet Take 80 mg by mouth daily.   . cyclobenzaprine (FLEXERIL) 10 MG tablet 10 mg 3 (three) times daily as needed. Restless Leg- Usually at Bedtime.  . dapagliflozin propanediol (FARXIGA) 10 MG TABS tablet Take by mouth.  . gabapentin (NEURONTIN) 300 MG capsule Take 300 mg by mouth daily. Sciatic Nerve Pain- taking only at bedtime.  Marland Kitchen lisinopril-hydrochlorothiazide (ZESTORETIC) 10-12.5 MG tablet TAKE 1 TABLET BY MOUTH EVERY MORNING FOR BLOOD PRESSURE  . metoprolol succinate (TOPROL-XL) 100 MG 24 hr tablet Take 100 mg by mouth daily. Take with or immediately following a meal.  . omeprazole (PRILOSEC) 20 MG capsule Take 20 mg by mouth daily.    PHQ 2/9 Scores 08/06/2019 07/04/2019 01/31/2019 02/14/2017  PHQ - 2 Score 0 2 0 0  PHQ- 9 Score 0 2 - -    BP Readings  from Last 3 Encounters:  08/06/19 118/76  07/04/19 124/78  06/04/19 115/83    Physical Exam Vitals and nursing note reviewed.  Constitutional:      General: She is not in acute distress.    Appearance: She is well-developed.  HENT:     Head: Normocephalic and atraumatic.     Right Ear: Tympanic membrane and ear canal normal.     Left Ear: Tympanic membrane and ear canal normal.     Nose:     Right Sinus: No maxillary sinus tenderness.     Left Sinus: No maxillary sinus tenderness.  Eyes:     General: No scleral icterus.       Right eye: No discharge.        Left eye: No discharge.     Conjunctiva/sclera:  Conjunctivae normal.  Neck:     Thyroid: No thyromegaly.     Vascular: No carotid bruit.  Cardiovascular:     Rate and Rhythm: Normal rate and regular rhythm.     Pulses: Normal pulses.     Heart sounds: Normal heart sounds.  Pulmonary:     Effort: Pulmonary effort is normal. No respiratory distress.     Breath sounds: No wheezing.  Chest:     Breasts:        Right: No mass, nipple discharge, skin change or tenderness.        Left: No mass, nipple discharge, skin change or tenderness.  Abdominal:     General: Bowel sounds are normal.     Palpations: Abdomen is soft.     Tenderness: There is no abdominal tenderness.  Musculoskeletal:        General: Normal range of motion.     Cervical back: Normal range of motion. No erythema.  Lymphadenopathy:     Cervical: No cervical adenopathy.  Skin:    General: Skin is warm and dry.     Capillary Refill: Capillary refill takes less than 2 seconds.     Findings: No rash.  Neurological:     General: No focal deficit present.     Mental Status: She is alert and oriented to person, place, and time.     Cranial Nerves: No cranial nerve deficit.     Sensory: No sensory deficit.     Deep Tendon Reflexes: Reflexes are normal and symmetric.     Comments: Brief episode of vertigo noted when sitting up on the exam table. Pt steadied and symptoms subsided after about 10 seconds.  Psychiatric:        Attention and Perception: Attention normal.        Mood and Affect: Mood normal.        Speech: Speech normal.        Behavior: Behavior normal.        Thought Content: Thought content normal.     Wt Readings from Last 3 Encounters:  08/06/19 254 lb (115.2 kg)  07/04/19 253 lb (114.8 kg)  06/04/19 250 lb (113.4 kg)    BP 118/76   Pulse 77   Temp 98.2 F (36.8 C) (Oral)   Ht 5\' 7"  (1.702 m)   Wt 254 lb (115.2 kg)   SpO2 98%   BMI 39.78 kg/m   Assessment and Plan: 1. Annual physical exam Normal exam except for weight  2. Encounter  for screening mammogram for breast cancer Done in October - will order next visit  3. Essential hypertension Clinically stable exam with well controlled BP on lisinopril  and metoprolol. Intermittent tachycardia generally well controlled by metoprolol. Tolerating medications without side effects at this time. Pt to continue current regimen and low sodium diet; benefits of regular exercise as able discussed. - CBC with Differential/Platelet - TSH - POCT urinalysis dipstick  4. Type II diabetes mellitus with complication (HCC) Clinically stable by exam and report without s/s of hypoglycemia. DM complicated by htn, lipids. Tolerating medications well without side effects or other concerns. - Comprehensive metabolic panel  5. GERD without esophagitis Symptoms well controlled on daily PPI No red flag signs such as weight loss, n/v, melena Will continue omeprazole.  6. Hyperlipidemia associated with type 2 diabetes mellitus ([place]) Tolerating statin medication without side effects at this time LDL is at goal of < 70 on current dose Continue same therapy without change at this time. - Lipid panel  7. Benign paroxysmal positional vertigo due to bilateral vestibular disorder Recurrent symptoms - being managed conservatively    Partially dictated using Editor, commissioning. Any errors are unintentional.  Halina Maidens, MD [facility]  08/06/2019

## 2019-08-07 LAB — CBC WITH DIFFERENTIAL/PLATELET
Basophils Absolute: 0.1 10*3/uL (ref 0.0–0.2)
Basos: 1 %
EOS (ABSOLUTE): 0.3 10*3/uL (ref 0.0–0.4)
Eos: 3 %
Hematocrit: 46 % (ref 34.0–46.6)
Hemoglobin: 15.5 g/dL (ref 11.1–15.9)
Immature Grans (Abs): 0.2 10*3/uL — ABNORMAL HIGH (ref 0.0–0.1)
Immature Granulocytes: 1 %
Lymphocytes Absolute: 3.1 10*3/uL (ref 0.7–3.1)
Lymphs: 26 %
MCH: 28.1 pg (ref 26.6–33.0)
MCHC: 33.7 g/dL (ref 31.5–35.7)
MCV: 84 fL (ref 79–97)
Monocytes Absolute: 0.8 10*3/uL (ref 0.1–0.9)
Monocytes: 6 %
Neutrophils Absolute: 7.6 10*3/uL — ABNORMAL HIGH (ref 1.4–7.0)
Neutrophils: 63 %
Platelets: 361 10*3/uL (ref 150–450)
RBC: 5.51 x10E6/uL — ABNORMAL HIGH (ref 3.77–5.28)
RDW: 14 % (ref 11.7–15.4)
WBC: 12 10*3/uL — ABNORMAL HIGH (ref 3.4–10.8)

## 2019-08-07 LAB — COMPREHENSIVE METABOLIC PANEL
ALT: 19 IU/L (ref 0–32)
AST: 18 IU/L (ref 0–40)
Albumin/Globulin Ratio: 2.3 — ABNORMAL HIGH (ref 1.2–2.2)
Albumin: 4.6 g/dL (ref 3.8–4.8)
Alkaline Phosphatase: 230 IU/L — ABNORMAL HIGH (ref 39–117)
BUN/Creatinine Ratio: 18 (ref 12–28)
BUN: 13 mg/dL (ref 8–27)
Bilirubin Total: 0.6 mg/dL (ref 0.0–1.2)
CO2: 23 mmol/L (ref 20–29)
Calcium: 10.1 mg/dL (ref 8.7–10.3)
Chloride: 97 mmol/L (ref 96–106)
Creatinine, Ser: 0.72 mg/dL (ref 0.57–1.00)
GFR calc Af Amer: 101 mL/min/{1.73_m2} (ref >59)
GFR calc non Af Amer: 88 mL/min/{1.73_m2} (ref >59)
Globulin, Total: 2 g/dL (ref 1.5–4.5)
Glucose: 96 mg/dL (ref 65–99)
Potassium: 5.2 mmol/L (ref 3.5–5.2)
Sodium: 138 mmol/L (ref 134–144)
Total Protein: 6.6 g/dL (ref 6.0–8.5)

## 2019-08-07 LAB — LIPID PANEL
Chol/HDL Ratio: 4.2 ratio (ref 0.0–4.4)
Cholesterol, Total: 148 mg/dL (ref 100–199)
HDL: 35 mg/dL — ABNORMAL LOW (ref >39)
LDL Chol Calc (NIH): 81 mg/dL (ref 0–99)
Triglycerides: 186 mg/dL — ABNORMAL HIGH (ref 0–149)
VLDL Cholesterol Cal: 32 mg/dL (ref 5–40)

## 2019-08-07 LAB — HEMOGLOBIN A1C
Est. average glucose Bld gHb Est-mCnc: 137 mg/dL
Hgb A1c MFr Bld: 6.4 % — ABNORMAL HIGH (ref 4.8–5.6)

## 2019-08-07 LAB — TSH: TSH: 2.11 u[IU]/mL (ref 0.450–4.500)

## 2019-08-08 ENCOUNTER — Encounter: Payer: Self-pay | Admitting: Internal Medicine

## 2019-08-08 DIAGNOSIS — R748 Abnormal levels of other serum enzymes: Secondary | ICD-10-CM | POA: Insufficient documentation

## 2019-08-11 ENCOUNTER — Telehealth: Payer: Self-pay

## 2019-08-11 NOTE — Telephone Encounter (Signed)
Patient called saying she is having some sinus drainage and a earache. Told her per Dr Army Melia to try and take sudafed and Flonase and if no better we may need to see her in the office as long as she has no fever or SOB.  CM

## 2019-08-21 ENCOUNTER — Encounter: Payer: Self-pay | Admitting: Internal Medicine

## 2019-10-23 ENCOUNTER — Other Ambulatory Visit: Payer: Self-pay | Admitting: Internal Medicine

## 2019-10-23 NOTE — Telephone Encounter (Signed)
Requested medication (s) are due for refill today:   Not sure  Requested medication (s) are on the active medication list:   Last refill 2019  Future visit scheduled:   Yes in 1 mo. With Dr. Army Melia   Last ordered: 08/27/2017 by a historical provider  Clinic note:   Returned because this was prescribed by a historical provider in 2019.   Requested Prescriptions  Pending Prescriptions Disp Refills   FARXIGA 10 MG TABS tablet [Pharmacy Med Name: FARXIGA 10 MG TABLET] 90 tablet 1    Sig: TAKE 1 TABLET BY MOUTH EVERY MORNING FOR DIABETES      Endocrinology:  Diabetes - SGLT2 Inhibitors Failed - 10/23/2019  1:35 PM      Failed - LDL in normal range and within 360 days    LDL Chol Calc (NIH)  Date Value Ref Range Status  08/06/2019 81 0 - 99 mg/dL Final          Passed - Cr in normal range and within 360 days    Creatinine, Ser  Date Value Ref Range Status  08/06/2019 0.72 0.57 - 1.00 mg/dL Final          Passed - HBA1C is between 0 and 7.9 and within 180 days    Hemoglobin A1C  Date Value Ref Range Status  08/13/2018 6.4  Final   Hgb A1c MFr Bld  Date Value Ref Range Status  08/06/2019 6.4 (H) 4.8 - 5.6 % Final    Comment:             Prediabetes: 5.7 - 6.4          Diabetes: >6.4          Glycemic control for adults with diabetes: <7.0           Passed - eGFR in normal range and within 360 days    GFR calc Af Amer  Date Value Ref Range Status  08/06/2019 101 >59 mL/min/1.73 Final   GFR calc non Af Amer  Date Value Ref Range Status  08/06/2019 88 >59 mL/min/1.73 Final          Passed - Valid encounter within last 6 months    Recent Outpatient Visits           2 months ago Annual physical exam   [facility] Glean Hess, MD   3 months ago Essential hypertension   [place], Laura H, MD   8 months ago Essential hypertension   [facility] Glean Hess, MD       Future Appointments             In 1  month Army Melia Jesse Sans, MD [facility], [facility]   In 9 months Army Melia, Jesse Sans, MD [facility], [facility]

## 2019-10-29 ENCOUNTER — Other Ambulatory Visit: Payer: Self-pay | Admitting: Internal Medicine

## 2019-10-29 NOTE — Telephone Encounter (Signed)
Requested medication (s) are due for refill today:yes  Requested medication (s) are on the active medication list: yes  Last refill:  historical provider  Future visit scheduled: yes  Notes to clinic:  Historical provider    Requested Prescriptions  Pending Prescriptions Disp Refills   metoprolol succinate (TOPROL-XL) 100 MG 24 hr tablet [Pharmacy Med Name: METOPROLOL SUCC ER 100 MG TAB] 90 tablet 3    Sig: TAKE 1 TABLET BY MOUTH EVERY DAY IN THE EVENING      Cardiovascular:  Beta Blockers Passed - 10/29/2019  9:41 AM      Passed - Last BP in normal range    BP Readings from Last 1 Encounters:  08/06/19 118/76          Passed - Last Heart Rate in normal range    Pulse Readings from Last 1 Encounters:  08/06/19 77          Passed - Valid encounter within last 6 months    Recent Outpatient Visits           2 months ago Annual physical exam   [facility] Glean Hess, MD   3 months ago Essential hypertension   [facility] Glean Hess, MD   9 months ago Essential hypertension   [facility] Glean Hess, MD       Future Appointments             In 1 month Army Melia Jesse Sans, MD [facility], [place]   In 9 months Army Melia, Jesse Sans, MD [facility], [facility]

## 2019-11-05 ENCOUNTER — Ambulatory Visit (INDEPENDENT_AMBULATORY_CARE_PROVIDER_SITE_OTHER): Payer: Medicare Other

## 2019-11-05 DIAGNOSIS — Z0000 Encounter for general adult medical examination without abnormal findings: Secondary | ICD-10-CM

## 2019-11-05 NOTE — Progress Notes (Signed)
Subjective:   Diane Morris is a 69 y.o. female who presents for Medicare Annual (Subsequent) preventive examination.  Virtual Visit via Telephone Note  I connected with  Ethelene Wente on 11/05/19 at 11:20 AM EDT by telephone and verified that I am speaking with the correct person using two identifiers.  Medicare Annual Wellness visit completed telephonically due to Covid-19 pandemic.   Location: Patient: home Provider: office   I discussed the limitations, risks, security and privacy concerns of performing an evaluation and management service by telephone and the availability of in person appointments. The patient expressed understanding and agreed to proceed.  Unable to perform video visit due to patient does not have video capability.   Some vital signs may be absent or patient reported.   Clemetine Marker, LPN    Review of Systems:   Cardiac Risk Factors include: advanced age (>42men, >74 women);diabetes mellitus;dyslipidemia;hypertension;obesity (BMI >30kg/m2)     Objective:     Vitals: There were no vitals taken for this visit.  There is no height or weight on file to calculate BMI.  Advanced Directives 11/05/2019 06/04/2019 01/03/2018 02/14/2017 10/09/2016  Does Patient Have a Medical Advance Directive? No No No No No  Would patient like information on creating a medical advance directive? Yes (MAU/Ambulatory/Procedural Areas - Information given) - Yes (MAU/Ambulatory/Procedural Areas - Information given) - No - Patient declined    Tobacco Social History   Tobacco Use  Smoking Status Former Smoker  . Packs/day: 1.00  . Years: 18.00  . Pack years: 18.00  . Types: Cigarettes  . Quit date: 06/12/2006  . Years since quitting: 13.4  Smokeless Tobacco Never Used     Counseling given: Not Answered   Clinical Intake:  Pre-visit preparation completed: Yes  Pain : No/denies pain     Nutritional Risks: Nausea/ vomitting/ diarrhea(nausea due to postnasal  drip) Diabetes: Yes CBG done?: No Did pt. bring in CBG monitor from home?: No   Nutrition Risk Assessment:  Has the patient had any N/V/D within the last 2 months?  Yes  Does the patient have any non-healing wounds?  No  Has the patient had any unintentional weight loss or weight gain?  No   Diabetes:  Is the patient diabetic?  Yes  If diabetic, was a CBG obtained today?  No  Did the patient bring in their glucometer from home?  No  How often do you monitor your CBG's? Pt does not check blood sugar.   Financial Strains and Diabetes Management:  Are you having any financial strains with the device, your supplies or your medication? No .  Does the patient want to be seen by Chronic Care Management for management of their diabetes?  No  Would the patient like to be referred to a Nutritionist or for Diabetic Management?  No   Diabetic Exams:  Diabetic Eye Exam: Completed 02/20/18 negative retinopathy. Overdue for diabetic eye exam. Pt has been advised about the importance in completing this exam.   Diabetic Foot Exam: Completed 01/31/19.    How often do you need to have someone help you when you read instructions, pamphlets, or other written materials from your doctor or pharmacy?: 1 - Never  Interpreter Needed?: No  Information entered by :: Clemetine Marker LPN  Past Medical History:  Diagnosis Date  . BPPV (benign paroxysmal positional vertigo)   . Depression   . Depression 11/09/2015  . Diabetes mellitus type 2, noninsulin dependent ([place])   . Dysrhythmia    tachycardia -  controlled with metoprolol  . GERD (gastroesophageal reflux disease)   . Hepatitis A    as teenager. treated  . Hepatitis B 12/2015   positive test, patient states she does not have hepatitis B, last panel was negative which was the 3rd panel  . Hx of blood clots 2016   left calf  . Hypercholesteremia   . Hypertension   . Long term current use of opiate analgesic 02/14/2017  . Long term prescription  opiate use 02/14/2017  . Opiate use 02/14/2017  . Psoriatic arthritis ([place])   . Sciatica of left side   . Vertigo    when anxious   Past Surgical History:  Procedure Laterality Date  . COLONOSCOPY    . COLONOSCOPY WITH PROPOFOL N/A 10/09/2016   Procedure: COLONOSCOPY WITH PROPOFOL;  Surgeon: [name], MD;  Location: [facility];  Service: Endoscopy;  Laterality: N/A;  Diabetic - oral meds  . DILATION AND CURETTAGE OF UTERUS    . ESOPHAGOGASTRODUODENOSCOPY (EGD) WITH PROPOFOL N/A 01/03/2018   Procedure: ESOPHAGOGASTRODUODENOSCOPY (EGD) WITH PROPOFOL;  Surgeon: [name], MD;  Location: [facility];  Service: Endoscopy;  Laterality: N/A;  Diabetic - oral meds  . INDUCED ABORTION    . POLYPECTOMY  10/09/2016   Procedure: POLYPECTOMY;  Surgeon: [name], MD;  Location: [facility];  Service: Endoscopy;;  . TUBAL LIGATION     Family History  Problem Relation Age of Onset  . Congestive Heart Failure Mother   . Emphysema Mother   . Heart disease Father   . Hypertension Sister   . Cancer Brother   . Hypertension Brother   . Breast cancer Neg Hx    Social History   Socioeconomic History  . Marital status: Widowed    Spouse name: Not on file  . Number of children: 2  . Years of education: Not on file  . Highest education level: Not on file  Occupational History  . Not on file  Tobacco Use  . Smoking status: Former Smoker    Packs/day: 1.00    Years: 18.00    Pack years: 18.00    Types: Cigarettes    Quit date: 06/12/2006    Years since quitting: 13.4  . Smokeless tobacco: Never Used  Substance and Sexual Activity  . Alcohol use: No  . Drug use: No  . Sexual activity: Not on file  Other Topics Concern  . Not on file  Social History Narrative   Pt lives alone. 2 children live in [place].    Social Determinants of Health   Financial Resource Strain: Low Risk   . Difficulty of Paying Living Expenses: Not hard at all  Food Insecurity: No Food  Insecurity  . Worried About Charity fundraiser in the Last Year: Never true  . Ran Out of Food in the Last Year: Never true  Transportation Needs: No Transportation Needs  . Lack of Transportation (Medical): No  . Lack of Transportation (Non-Medical): No  Physical Activity: Inactive  . Days of Exercise per Week: 0 days  . Minutes of Exercise per Session: 0 min  Stress: No Stress Concern Present  . Feeling of Stress : Not at all  Social Connections: Unknown  . Frequency of Communication with Friends and Family: Patient refused  . Frequency of Social Gatherings with Friends and Family: Patient refused  . Attends Religious Services: Patient refused  . Active Member of Clubs or Organizations: Patient refused  . Attends Archivist Meetings: Patient  refused  . Marital Status: Widowed    Outpatient Encounter Medications as of 11/05/2019  Medication Sig  . Apremilast 30 MG TABS Take 1 tablet by mouth 2 (two) times daily. OTEZLA  . atorvastatin (LIPITOR) 80 MG tablet Take 80 mg by mouth daily.   . cyclobenzaprine (FLEXERIL) 10 MG tablet 10 mg 3 (three) times daily as needed. Restless Leg- Usually at Bedtime.  Marland Kitchen FARXIGA 10 MG TABS tablet TAKE 1 TABLET BY MOUTH EVERY MORNING FOR DIABETES  . gabapentin (NEURONTIN) 300 MG capsule Take 300 mg by mouth daily. Sciatic Nerve Pain- taking only at bedtime.  Marland Kitchen lisinopril-hydrochlorothiazide (ZESTORETIC) 10-12.5 MG tablet TAKE 1 TABLET BY MOUTH EVERY MORNING FOR BLOOD PRESSURE  . loratadine (CLARITIN) 10 MG tablet Take 10 mg by mouth daily.  . metoprolol succinate (TOPROL-XL) 100 MG 24 hr tablet TAKE 1 TABLET BY MOUTH EVERY DAY IN THE EVENING  . omeprazole (PRILOSEC) 20 MG capsule Take 20 mg by mouth daily.   No facility-administered encounter medications on file as of 11/05/2019.    Activities of Daily Living In your present state of health, do you have any difficulty performing the following activities: 11/05/2019 01/31/2019  Hearing? N N   Comment declines hearing aids -  Vision? N N  Difficulty concentrating or making decisions? N N  Walking or climbing stairs? N N  Dressing or bathing? N N  Doing errands, shopping? N N  Preparing Food and eating ? N -  Using the Toilet? N -  In the past six months, have you accidently leaked urine? N -  Do you have problems with loss of bowel control? N -  Managing your Medications? N -  Managing your Finances? N -  Housekeeping or managing your Housekeeping? N -  Some recent data might be hidden    Patient Care Team: Glean Hess, MD as PCP - General (Internal Medicine) [name], MD as Consulting Physician (Gastroenterology) Perrin Maltese, MD as Referring Physician (Cardiology) Eulogio Bear, MD as Consulting Physician (Ophthalmology) Marlowe Sax, MD as Referring Physician (Internal Medicine)    Assessment:   This is a routine wellness examination for [place].  Exercise Activities and Dietary recommendations Current Exercise Habits: The patient does not participate in regular exercise at present, Exercise limited by: None identified  Goals    . Weight (lb) < 230 lb (104.3 kg)     Pt states she would like to lose weight over the next year with healthy eating and physical activity        Fall Risk Fall Risk  11/05/2019 01/31/2019 02/14/2017  Falls in the past year? 0 0 No  Number falls in past yr: 0 0 -  Injury with Fall? 0 0 -  Risk for fall due to : Other (Comment) - -  Risk for fall due to: Comment frequent vertigo - -  Follow up Falls prevention discussed Falls evaluation completed -   FALL RISK PREVENTION PERTAINING TO THE HOME:  Any stairs in or around the home? No  If so, are there any without handrails? No   Home free of loose throw rugs in walkways, pet beds, electrical cords, etc? Yes  Adequate lighting in your home to reduce risk of falls? Yes   ASSISTIVE DEVICES UTILIZED TO PREVENT FALLS:  Life alert? No  Use of a cane,  walker or w/c? No  Grab bars in the bathroom? No  Shower chair or bench in shower? No  Elevated toilet seat or a  handicapped toilet? No   DME ORDERS:  DME order needed?  No   TIMED UP AND GO:  Was the test performed? No . Telephonic visit.   Education: Fall risk prevention has been discussed.  Intervention(s) required? No   DME/home health order needed?  No    Depression Screen PHQ 2/9 Scores 11/05/2019 08/06/2019 07/04/2019 01/31/2019  PHQ - 2 Score 0 0 2 0  PHQ- 9 Score - 0 2 -     Cognitive Function     6CIT Screen 11/05/2019  What Year? 0 points  What month? 0 points  What time? 0 points  Count back from 20 0 points  Months in reverse 0 points  Repeat phrase 0 points  Total Score 0    Immunization History  Administered Date(s) Administered  . Moderna SARS-COVID-2 Vaccination 09/24/2019, 10/22/2019    Qualifies for Shingles Vaccine? Yes  . Due for Shingrix. Education has been provided regarding the importance of this vaccine. Pt has been advised to call insurance company to determine out of pocket expense. Advised may also receive vaccine at local pharmacy or Health Dept. Verbalized acceptance and understanding.  Tdap: Although this vaccine is not a covered service during a Wellness Exam, does the patient still wish to receive this vaccine today?  No .  Education has been provided regarding the importance of this vaccine. Advised may receive this vaccine at local pharmacy or Health Dept. Aware to provide a copy of the vaccination record if obtained from local pharmacy or Health Dept. Verbalized acceptance and understanding.  Flu Vaccine: Due for Flu vaccine. Does the patient want to receive this vaccine today?  No . Education has been provided regarding the importance of this vaccine but still declined. Advised may receive this vaccine at local pharmacy or Health Dept. Aware to provide a copy of the vaccination record if obtained from local pharmacy or Health Dept.  Verbalized acceptance and understanding.  Pneumococcal Vaccine: Due for Pneumococcal vaccine. Does the patient want to receive this vaccine today?  No . Education has been provided regarding the importance of this vaccine but still declined. Advised may receive this vaccine at local pharmacy or Health Dept. Aware to provide a copy of the vaccination record if obtained from local pharmacy or Health Dept. Verbalized acceptance and understanding.  Covid-19 Vaccine: Up to date   Screening Tests Health Maintenance  Topic Date Due  . DEXA SCAN  Never done  . OPHTHALMOLOGY EXAM  02/21/2019  . TETANUS/TDAP  07/03/2020 (Originally 01/27/1970)  . PNA vac Low Risk Adult (1 of 2 - PCV13) 08/05/2020 (Originally 01/26/2018)  . INFLUENZA VACCINE  01/11/2020  . FOOT EXAM  01/31/2020  . HEMOGLOBIN A1C  02/03/2020  . MAMMOGRAM  03/25/2020  . COLONOSCOPY  10/09/2021  . COVID-19 Vaccine  Completed  . Hepatitis C Screening  Completed    Cancer Screenings:  Colorectal Screening: Completed 10/09/16. Repeat every 5 years;   Mammogram: Completed 03/26/19. Repeat every year;   Bone Density: DUE. Pt declines screening at this time.   Lung Cancer Screening: (Low Dose CT Chest recommended if Age 59-80 years, 30 pack-year currently smoking OR have quit w/in 15years.) does not qualify.    Additional Screening:  Hepatitis C Screening: does qualify; Completed 07/04/19  Vision Screening: Recommended annual ophthalmology exams for early detection of glaucoma and other disorders of the eye. Is the patient up to date with their annual eye exam?  No  - postponed due to Covid Who is the provider  or what is the name of the office in which the pt attends annual eye exams? [place] Screening: Recommended annual dental exams for proper oral hygiene  Community Resource Referral:  CRR required this visit?  No      Plan:     I have personally reviewed and addressed the Medicare Annual Wellness  questionnaire and have noted the following in the patient's chart:  A. Medical and social history B. Use of alcohol, tobacco or illicit drugs  C. Current medications and supplements D. Functional ability and status E.  Nutritional status F.  Physical activity G. Advance directives H. List of other physicians I.  Hospitalizations, surgeries, and ER visits in previous 12 months J.  [place] such as hearing and vision if needed, cognitive and depression L. Referrals and appointments   In addition, I have reviewed and discussed with patient certain preventive protocols, quality metrics, and best practice recommendations. A written personalized care plan for preventive services as well as general preventive health recommendations were provided to patient.   Signed,  Clemetine Marker, LPN Nurse Health Advisor   Nurse Notes: none

## 2019-11-05 NOTE — Patient Instructions (Signed)
Diane Morris , Thank you for taking time to come for your Medicare Wellness Visit. I appreciate your ongoing commitment to your health goals. Please review the following plan we discussed and let me know if I can assist you in the future.   Screening recommendations/referrals: Colonoscopy: done 10/09/16 Mammogram: done 03/26/19 Bone Density: due Recommended yearly ophthalmology/optometry visit for glaucoma screening and checkup Recommended yearly dental visit for hygiene and checkup  Vaccinations: Influenza vaccine: postponed Pneumococcal vaccine: postponed Tdap vaccine: due Shingles vaccine: Shingrix discussed. Please contact your pharmacy for coverage information.  Covid-19: done 09/24/19 & 10/22/19  Advanced directives: Advance directive discussed with you today. I have provided a copy for you to complete at home and have notarized. Once this is complete please bring a copy in to our office so we can scan it into your chart.  Conditions/risks identified: Recommend healthy eating and physical activity for desired weight loss  Next appointment: Please follow up in one year for your Medicare Annual Wellness visit.     Preventive Care 26 Years and Older, Female Preventive care refers to lifestyle choices and visits with your health care provider that can promote health and wellness. What does preventive care include?  A yearly physical exam. This is also called an annual well check.  Dental exams once or twice a year.  Routine eye exams. Ask your health care provider how often you should have your eyes checked.  Personal lifestyle choices, including:  Daily care of your teeth and gums.  Regular physical activity.  Eating a healthy diet.  Avoiding tobacco and drug use.  Limiting alcohol use.  Practicing safe sex.  Taking low-dose aspirin every day.  Taking vitamin and mineral supplements as recommended by your health care provider. What happens during an annual well  check? The services and screenings done by your health care provider during your annual well check will depend on your age, overall health, lifestyle risk factors, and family history of disease. Counseling  Your health care provider may ask you questions about your:  Alcohol use.  Tobacco use.  Drug use.  Emotional well-being.  Home and relationship well-being.  Sexual activity.  Eating habits.  History of falls.  Memory and ability to understand (cognition).  Work and work Statistician.  Reproductive health. Screening  You may have the following tests or measurements:  Height, weight, and BMI.  Blood pressure.  Lipid and cholesterol levels. These may be checked every 5 years, or more frequently if you are over 52 years old.  Skin check.  Lung cancer screening. You may have this screening every year starting at age 52 if you have a 30-pack-year history of smoking and currently smoke or have quit within the past 15 years.  Fecal occult blood test (FOBT) of the stool. You may have this test every year starting at age 42.  Flexible sigmoidoscopy or colonoscopy. You may have a sigmoidoscopy every 5 years or a colonoscopy every 10 years starting at age 28.  Hepatitis C blood test.  Hepatitis B blood test.  Sexually transmitted disease (STD) testing.  Diabetes screening. This is done by checking your blood sugar (glucose) after you have not eaten for a while (fasting). You may have this done every 1-3 years.  Bone density scan. This is done to screen for osteoporosis. You may have this done starting at age 31.  Mammogram. This may be done every 1-2 years. Talk to your health care provider about how often you should have regular mammograms.  Talk with your health care provider about your test results, treatment options, and if necessary, the need for more tests. Vaccines  Your health care provider may recommend certain vaccines, such as:  Influenza vaccine. This is  recommended every year.  Tetanus, diphtheria, and acellular pertussis (Tdap, Td) vaccine. You may need a Td booster every 10 years.  Zoster vaccine. You may need this after age 44.  Pneumococcal 13-valent conjugate (PCV13) vaccine. One dose is recommended after age 60.  Pneumococcal polysaccharide (PPSV23) vaccine. One dose is recommended after age 4. Talk to your health care provider about which screenings and vaccines you need and how often you need them. This information is not intended to replace advice given to you by your health care provider. Make sure you discuss any questions you have with your health care provider. Document Released: 06/25/2015 Document Revised: 02/16/2016 Document Reviewed: 03/30/2015 Elsevier Interactive Patient Education  2017 [employer] Prevention in the Home Falls can cause injuries. They can happen to people of all ages. There are many things you can do to make your home safe and to help prevent falls. What can I do on the outside of my home?  Regularly fix the edges of walkways and driveways and fix any cracks.  Remove anything that might make you trip as you walk through a door, such as a raised step or threshold.  Trim any bushes or trees on the path to your home.  Use bright outdoor lighting.  Clear any walking paths of anything that might make someone trip, such as rocks or tools.  Regularly check to see if handrails are loose or broken. Make sure that both sides of any steps have handrails.  Any raised decks and porches should have guardrails on the edges.  Have any leaves, snow, or ice cleared regularly.  Use sand or salt on walking paths during winter.  Clean up any spills in your garage right away. This includes oil or grease spills. What can I do in the bathroom?  Use night lights.  Install grab bars by the toilet and in the tub and shower. Do not use towel bars as grab bars.  Use non-skid mats or decals in the tub or  shower.  If you need to sit down in the shower, use a plastic, non-slip stool.  Keep the floor dry. Clean up any water that spills on the floor as soon as it happens.  Remove soap buildup in the tub or shower regularly.  Attach bath mats securely with double-sided non-slip rug tape.  Do not have throw rugs and other things on the floor that can make you trip. What can I do in the bedroom?  Use night lights.  Make sure that you have a light by your bed that is easy to reach.  Do not use any sheets or blankets that are too big for your bed. They should not hang down onto the floor.  Have a firm chair that has side arms. You can use this for support while you get dressed.  Do not have throw rugs and other things on the floor that can make you trip. What can I do in the kitchen?  Clean up any spills right away.  Avoid walking on wet floors.  Keep items that you use a lot in easy-to-reach places.  If you need to reach something above you, use a strong step stool that has a grab bar.  Keep electrical cords out of the way.  Do not use floor polish or wax that makes floors slippery. If you must use wax, use non-skid floor wax.  Do not have throw rugs and other things on the floor that can make you trip. What can I do with my stairs?  Do not leave any items on the stairs.  Make sure that there are handrails on both sides of the stairs and use them. Fix handrails that are broken or loose. Make sure that handrails are as long as the stairways.  Check any carpeting to make sure that it is firmly attached to the stairs. Fix any carpet that is loose or worn.  Avoid having throw rugs at the top or bottom of the stairs. If you do have throw rugs, attach them to the floor with carpet tape.  Make sure that you have a light switch at the top of the stairs and the bottom of the stairs. If you do not have them, ask someone to add them for you. What else can I do to help prevent  falls?  Wear shoes that:  Do not have high heels.  Have rubber bottoms.  Are comfortable and fit you well.  Are closed at the toe. Do not wear sandals.  If you use a stepladder:  Make sure that it is fully opened. Do not climb a closed stepladder.  Make sure that both sides of the stepladder are locked into place.  Ask someone to hold it for you, if possible.  Clearly mark and make sure that you can see:  Any grab bars or handrails.  First and last steps.  Where the edge of each step is.  Use tools that help you move around (mobility aids) if they are needed. These include:  Canes.  Walkers.  Scooters.  Crutches.  Turn on the lights when you go into a dark area. Replace any light bulbs as soon as they burn out.  Set up your furniture so you have a clear path. Avoid moving your furniture around.  If any of your floors are uneven, fix them.  If there are any pets around you, be aware of where they are.  Review your medicines with your doctor. Some medicines can make you feel dizzy. This can increase your chance of falling. Ask your doctor what other things that you can do to help prevent falls. This information is not intended to replace advice given to you by your health care provider. Make sure you discuss any questions you have with your health care provider. Document Released: 03/25/2009 Document Revised: 11/04/2015 Document Reviewed: 07/03/2014 Elsevier Interactive Patient Education  2017 [employer].

## 2019-12-07 ENCOUNTER — Encounter: Payer: Self-pay | Admitting: Internal Medicine

## 2019-12-07 DIAGNOSIS — K760 Fatty (change of) liver, not elsewhere classified: Secondary | ICD-10-CM | POA: Insufficient documentation

## 2019-12-08 ENCOUNTER — Ambulatory Visit (INDEPENDENT_AMBULATORY_CARE_PROVIDER_SITE_OTHER): Payer: Medicare Other | Admitting: Internal Medicine

## 2019-12-08 ENCOUNTER — Encounter: Payer: Self-pay | Admitting: Internal Medicine

## 2019-12-08 ENCOUNTER — Other Ambulatory Visit: Payer: Self-pay

## 2019-12-08 VITALS — BP 112/76 | HR 94 | Temp 97.9°F | Ht 67.0 in | Wt 258.0 lb

## 2019-12-08 DIAGNOSIS — E1169 Type 2 diabetes mellitus with other specified complication: Secondary | ICD-10-CM

## 2019-12-08 DIAGNOSIS — E118 Type 2 diabetes mellitus with unspecified complications: Secondary | ICD-10-CM

## 2019-12-08 DIAGNOSIS — L4050 Arthropathic psoriasis, unspecified: Secondary | ICD-10-CM

## 2019-12-08 DIAGNOSIS — H8113 Benign paroxysmal vertigo, bilateral: Secondary | ICD-10-CM

## 2019-12-08 DIAGNOSIS — I10 Essential (primary) hypertension: Secondary | ICD-10-CM

## 2019-12-08 DIAGNOSIS — F39 Unspecified mood [affective] disorder: Secondary | ICD-10-CM | POA: Insufficient documentation

## 2019-12-08 DIAGNOSIS — E785 Hyperlipidemia, unspecified: Secondary | ICD-10-CM

## 2019-12-08 DIAGNOSIS — Z1231 Encounter for screening mammogram for malignant neoplasm of breast: Secondary | ICD-10-CM | POA: Diagnosis not present

## 2019-12-08 DIAGNOSIS — H6690 Otitis media, unspecified, unspecified ear: Secondary | ICD-10-CM

## 2019-12-08 MED ORDER — VENLAFAXINE HCL ER 37.5 MG PO CP24: 37.5000 mg | ORAL_CAPSULE | Freq: Every day | ORAL | 2 refills | Status: DC

## 2019-12-08 MED ORDER — AZITHROMYCIN 250 MG PO TABS: ORAL_TABLET | ORAL | 0 refills | Status: AC

## 2019-12-08 NOTE — Progress Notes (Signed)
Date:  12/08/2019   Name:  Diane Morris   DOB:  July 14, 1950   MRN:  [id number]   Chief Complaint: Diabetes, Hypertension, BPPV (Seen ENT in [place]. Does not want to go back. Getting worse. Using a pushing kart to keep her balance. ), and Ear Pain (Left ear pain. )  Diabetes She presents for her follow-up diabetic visit. She has type 2 diabetes mellitus. Her disease course has been stable. Hypoglycemia symptoms include dizziness and nervousness/anxiousness. Pertinent negatives for hypoglycemia include no headaches or tremors. Pertinent negatives for diabetes include no chest pain, no fatigue, no polydipsia and no polyuria. Current diabetic treatment includes oral agent (monotherapy) (farxiga). An ACE inhibitor/angiotensin II receptor blocker is being taken. Eye exam is not current.  Hypertension This is a chronic problem. The problem is controlled. Pertinent negatives include no chest pain, headaches, palpitations or shortness of breath. Past treatments include ACE inhibitors and diuretics. The current treatment provides significant improvement. There are no compliance problems.   Dizziness This is a chronic problem. The problem occurs daily. The problem has been unchanged. Pertinent negatives include no abdominal pain, arthralgias, chest pain, coughing, fatigue, fever, headaches, numbness or rash.    Lab Results  Component Value Date   CREATININE 0.72 08/06/2019   BUN 13 08/06/2019   NA 138 08/06/2019   K 5.2 08/06/2019   CL 97 08/06/2019   CO2 23 08/06/2019   Lab Results  Component Value Date   CHOL 148 08/06/2019   HDL 35 (L) 08/06/2019   LDLCALC 81 08/06/2019   TRIG 186 (H) 08/06/2019   CHOLHDL 4.2 08/06/2019   Lab Results  Component Value Date   TSH 2.110 08/06/2019   Lab Results  Component Value Date   HGBA1C 6.4 (H) 08/06/2019   Lab Results  Component Value Date   WBC 12.0 (H) 08/06/2019   HGB 15.5 08/06/2019   HCT 46.0 08/06/2019   MCV 84 08/06/2019   PLT  361 08/06/2019   Lab Results  Component Value Date   ALT 19 08/06/2019   AST 18 08/06/2019   ALKPHOS 230 (H) 08/06/2019   BILITOT 0.6 08/06/2019     Review of Systems  Constitutional: Negative for appetite change, fatigue, fever and unexpected weight change.  HENT: Positive for ear pain (on left for past 2 weeks) and tinnitus. Negative for ear discharge, hearing loss and trouble swallowing.   Eyes: Negative for visual disturbance.  Respiratory: Negative for cough, chest tightness and shortness of breath.   Cardiovascular: Negative for chest pain, palpitations and leg swelling.  Gastrointestinal: Negative for abdominal pain.  Endocrine: Negative for polydipsia and polyuria.  Genitourinary: Negative for dysuria and hematuria.  Musculoskeletal: Positive for gait problem (has to use a cart for support). Negative for arthralgias.  Skin: Negative for color change and rash.  Neurological: Positive for dizziness. Negative for tremors, numbness and headaches.  Psychiatric/Behavioral: Positive for dysphoric mood and sleep disturbance. Negative for suicidal ideas. The patient is nervous/anxious.     Patient Active Problem List   Diagnosis Date Noted  . Hepatic steatosis 12/07/2019  . Elevated alkaline phosphatase level 08/08/2019  . Hyperlipidemia associated with type 2 diabetes mellitus ([place]) 08/06/2019  . Essential hypertension 01/31/2019  . GERD without esophagitis 01/31/2019  . Type II diabetes mellitus with complication ([place]) 03/18/1218  . Morbid obesity ([place]) 01/31/2019  . Benign paroxysmal positional vertigo due to bilateral vestibular disorder 01/31/2019  . History of gastritis   . Vitamin D deficiency 02/19/2017  .  Chronic low back pain (Primary Area of Pain) (L>R) 02/14/2017  . Chronic hip pain (Secondary Area of Pain) (L>R) 02/14/2017  . Chronic right shoulder pain (Tertiary Area of Pain) 02/14/2017  . Bilateral chronic knee pain (Fourth Area of Pain) (L>R) 02/14/2017  .  Chronic neck pain 02/14/2017  . Chronic upper extremity pain 02/14/2017  . Chronic sacroiliac joint pain 02/14/2017  . Adenomatous polyp of colon   . CTS (carpal tunnel syndrome) 08/28/2016  . Hepatitis B surface antigen positive 11/19/2015  . Enthesitis 11/19/2015  . Facet joint disease of lumbosacral region ([place]) 11/19/2015  . Chronic bilateral low back pain with left-sided sciatica 11/09/2015  . Psoriasis 11/09/2015  . Psoriatic arthritis ([place]) 11/09/2015  . Tendinitis of right foot 11/09/2015    Allergies  Allergen Reactions  . Bee Venom Swelling  . Contrast Media [Iodinated Diagnostic Agents] Shortness Of Breath  . Codeine Nausea And Vomiting       . Shellfish Allergy Nausea And Vomiting, Swelling and Rash       . Sodium Clavulanate Diarrhea    Past Surgical History:  Procedure Laterality Date  . COLONOSCOPY    . COLONOSCOPY WITH PROPOFOL N/A 10/09/2016   Procedure: COLONOSCOPY WITH PROPOFOL;  Surgeon: [name], MD;  Location: [place];  Service: Endoscopy;  Laterality: N/A;  Diabetic - oral meds  . DILATION AND CURETTAGE OF UTERUS    . ESOPHAGOGASTRODUODENOSCOPY (EGD) WITH PROPOFOL N/A 01/03/2018   Procedure: ESOPHAGOGASTRODUODENOSCOPY (EGD) WITH PROPOFOL;  Surgeon: [name], MD;  Location: [place];  Service: Endoscopy;  Laterality: N/A;  Diabetic - oral meds  . INDUCED ABORTION    . POLYPECTOMY  10/09/2016   Procedure: POLYPECTOMY;  Surgeon: [name], MD;  Location: [place];  Service: Endoscopy;;  . TUBAL LIGATION      Social History   Tobacco Use  . Smoking status: Former Smoker    Packs/day: 1.00    Years: 18.00    Pack years: 18.00    Types: Cigarettes    Quit date: 06/12/2006    Years since quitting: 13.4  . Smokeless tobacco: Never Used  Vaping Use  . Vaping Use: Never used  Substance Use Topics  . Alcohol use: No  . Drug use: No     Medication list has been reviewed and updated.  Current Meds  Medication  Sig  . Apremilast 30 MG TABS Take 1 tablet by mouth 2 (two) times daily. OTEZLA  . atorvastatin (LIPITOR) 80 MG tablet Take 80 mg by mouth daily.   . cyclobenzaprine (FLEXERIL) 10 MG tablet 10 mg 3 (three) times daily as needed. Restless Leg- Usually at Bedtime.  Marland Kitchen FARXIGA 10 MG TABS tablet TAKE 1 TABLET BY MOUTH EVERY MORNING FOR DIABETES  . gabapentin (NEURONTIN) 300 MG capsule Take 300 mg by mouth daily. Sciatic Nerve Pain- taking only at bedtime.  Marland Kitchen lisinopril-hydrochlorothiazide (ZESTORETIC) 10-12.5 MG tablet TAKE 1 TABLET BY MOUTH EVERY MORNING FOR BLOOD PRESSURE  . loratadine (CLARITIN) 10 MG tablet Take 10 mg by mouth daily.  . meclizine (ANTIVERT) 25 MG tablet Take 25 mg by mouth 3 (three) times daily as needed for dizziness.  . metoprolol succinate (TOPROL-XL) 100 MG 24 hr tablet TAKE 1 TABLET BY MOUTH EVERY DAY IN THE EVENING  . omeprazole (PRILOSEC) 20 MG capsule Take 20 mg by mouth daily.    PHQ 2/9 Scores 12/08/2019 11/05/2019 08/06/2019 07/04/2019  PHQ - 2 Score 4 0 0 2  PHQ- 9 Score 10 -  0 2    GAD 7 : Generalized Anxiety Score 12/08/2019  Nervous, Anxious, on Edge 2  Control/stop worrying 2  Worry too much - different things 2  Trouble relaxing 2  Restless 2  Easily annoyed or irritable 2  Afraid - awful might happen 2  Total GAD 7 Score 14  Anxiety Difficulty Somewhat difficult    BP Readings from Last 3 Encounters:  12/08/19 112/76  08/06/19 118/76  07/04/19 124/78    Physical Exam Vitals and nursing note reviewed.  Constitutional:      General: She is not in acute distress.    Appearance: Normal appearance. She is well-developed.  HENT:     Head: Normocephalic and atraumatic.     Right Ear: Tympanic membrane and ear canal normal. Tympanic membrane is not erythematous or retracted.     Left Ear: Ear canal normal. Tympanic membrane is not erythematous or retracted.     Ears:     Comments: Left TM dull Cardiovascular:     Rate and Rhythm: Normal rate and  regular rhythm.     Pulses: Normal pulses.  Pulmonary:     Effort: Pulmonary effort is normal. No respiratory distress.     Breath sounds: No wheezing or rhonchi.  Musculoskeletal:        General: Normal range of motion.     Cervical back: Normal range of motion.  Lymphadenopathy:     Cervical: No cervical adenopathy.  Skin:    General: Skin is warm and dry.     Findings: No rash.  Neurological:     Mental Status: She is alert and oriented to person, place, and time.  Psychiatric:        Behavior: Behavior normal.        Thought Content: Thought content normal.     Wt Readings from Last 3 Encounters:  12/08/19 258 lb (117 kg)  08/06/19 254 lb (115.2 kg)  07/04/19 253 lb (114.8 kg)    BP 112/76 (BP Location: Right Arm, Patient Position: Sitting, Cuff Size: Large)   Pulse 94   Temp 97.9 F (36.6 C) (Oral)   Ht 5\' 7"  (1.702 m)   Wt 258 lb (117 kg)   SpO2 96%   BMI 40.41 kg/m   Assessment and Plan: 1. Type II diabetes mellitus with complication (HCC) Clinically stable by exam and report without s/s of hypoglycemia. DM complicated by HTN. She is reminded to schedule DM eye exam Tolerating medications - Farxiga -  well without side effects or other concerns. - Hemoglobin Q2V - Basic metabolic panel  2. Essential hypertension Clinically stable exam with well controlled BP on metoprolol, lisinopril hct. Tolerating medications without side effects at this time. Pt to continue current regimen and low sodium diet; benefits of regular exercise as able discussed.  3. Hyperlipidemia associated with type 2 diabetes mellitus ([place]) On high dose statin therapy without side effects  4. Encounter for screening mammogram for breast cancer - MM 3D SCREEN BREAST BILATERAL; Future  5. Benign paroxysmal positional vertigo due to bilateral vestibular disorder Symptoms are worsening and causing significant distress Last seen by ENT 2 years ago so will refer again to Dr. Kathyrn Sheriff Begin  low dose effexor for mood disorder - Ambulatory referral to ENT - venlafaxine XR (EFFEXOR XR) 37.5 MG 24 hr capsule; Take 1 capsule (37.5 mg total) by mouth daily with breakfast.  Dispense: 30 capsule; Refill: 2  6. Subacute otitis media, unspecified otitis media type - azithromycin (ZITHROMAX Z-PAK)  250 MG tablet; UAD  Dispense: 6 each; Refill: 0  7. Psoriatic arthritis ([place]) Recently stopped Otezla by her choice Followed by rheumatology   8. Morbid obesity ([place])    Partially dictated using Editor, commissioning. Any errors are unintentional.  Halina Maidens, MD [facility]  12/08/2019

## 2019-12-09 LAB — BASIC METABOLIC PANEL
BUN/Creatinine Ratio: 19 (ref 12–28)
BUN: 15 mg/dL (ref 8–27)
CO2: 25 mmol/L (ref 20–29)
Calcium: 10.1 mg/dL (ref 8.7–10.3)
Chloride: 99 mmol/L (ref 96–106)
Creatinine, Ser: 0.79 mg/dL (ref 0.57–1.00)
GFR calc Af Amer: 90 mL/min/{1.73_m2} (ref >59)
GFR calc non Af Amer: 78 mL/min/{1.73_m2} (ref >59)
Glucose: 94 mg/dL (ref 65–99)
Potassium: 5.2 mmol/L (ref 3.5–5.2)
Sodium: 140 mmol/L (ref 134–144)

## 2019-12-09 LAB — HEMOGLOBIN A1C
Est. average glucose Bld gHb Est-mCnc: 143 mg/dL
Hgb A1c MFr Bld: 6.6 % — ABNORMAL HIGH (ref 4.8–5.6)

## 2019-12-30 ENCOUNTER — Other Ambulatory Visit: Payer: Self-pay | Admitting: Internal Medicine

## 2019-12-30 DIAGNOSIS — H8113 Benign paroxysmal vertigo, bilateral: Secondary | ICD-10-CM

## 2019-12-30 NOTE — Telephone Encounter (Signed)
Requested medication (s) are due for refill today: No  Requested medication (s) are on the active medication list: {yes  Last refill:  12/08/19  #30  2 refills  Future visit scheduled: yes  Notes to clinic:  pharmacy requesting Dx code and 90 day rx    Requested Prescriptions  Pending Prescriptions Disp Refills   venlafaxine XR (EFFEXOR-XR) 37.5 MG 24 hr capsule [Pharmacy Med Name: VENLAFAXINE HCL ER 37.5 MG CAP] 90 capsule 1    Sig: TAKE 1 CAPSULE BY MOUTH DAILY WITH BREAKFAST.      Psychiatry: Antidepressants - SNRI - desvenlafaxine & venlafaxine Failed - 12/30/2019 12:30 PM      Failed - LDL in normal range and within 360 days    LDL Chol Calc (NIH)  Date Value Ref Range Status  08/06/2019 81 0 - 99 mg/dL Final          Failed - Triglycerides in normal range and within 360 days    Triglycerides  Date Value Ref Range Status  08/06/2019 186 (H) 0 - 149 mg/dL Final          Passed - Total Cholesterol in normal range and within 360 days    Cholesterol, Total  Date Value Ref Range Status  08/06/2019 148 100 - 199 mg/dL Final          Passed - Last BP in normal range    BP Readings from Last 1 Encounters:  12/08/19 112/76          Passed - Valid encounter within last 6 months    Recent Outpatient Visits           3 weeks ago Type II diabetes mellitus with complication [facility])   [facility] Glean Hess, MD   4 months ago Annual physical exam   [facility] Glean Hess, MD   5 months ago Essential hypertension   [facility] Glean Hess, MD   11 months ago Essential hypertension   [facility] Glean Hess, MD       Future Appointments             In 3 months Army Melia Jesse Sans, MD [facility], [place]   In 7 months Army Melia, Jesse Sans, MD [facility], [facility]

## 2020-01-19 ENCOUNTER — Telehealth: Payer: Self-pay | Admitting: Internal Medicine

## 2020-01-19 NOTE — Telephone Encounter (Signed)
Requested medication (s) are due for refill today:  Provider to determine  Requested medication (s) are on the active medication list:   Yes  Future visit scheduled:   Yes   Last ordered: 08/15/2015  Returned because it's non delegated plus it was prescribed by a historical provider 3 yrs ago.   Requested Prescriptions  Pending Prescriptions Disp Refills   cyclobenzaprine (FLEXERIL) 10 MG tablet [Pharmacy Med Name: CYCLOBENZAPRINE 10 MG TABLET] 270 tablet 1    Sig: TAKE 1 TABLET BY MOUTH 3 TIMES A DAY AS NEEDED      Not Delegated - Analgesics:  Muscle Relaxants Failed - 01/19/2020 11:13 AM      Failed - This refill cannot be delegated      Passed - Valid encounter within last 6 months    Recent Outpatient Visits           1 month ago Type II diabetes mellitus with complication [facility])   [facility] Glean Hess, MD   5 months ago Annual physical exam   [facility] Glean Hess, MD   6 months ago Essential hypertension   [facility] Glean Hess, MD   11 months ago Essential hypertension   [facility] Glean Hess, MD       Future Appointments             In 2 months Army Melia Jesse Sans, MD [facility], [place]   In 6 months Army Melia, Jesse Sans, MD [facility], [facility]

## 2020-01-22 ENCOUNTER — Ambulatory Visit: Payer: Medicare Other | Admitting: Internal Medicine

## 2020-01-22 ENCOUNTER — Other Ambulatory Visit: Payer: Self-pay | Admitting: Internal Medicine

## 2020-01-22 NOTE — Telephone Encounter (Signed)
Pt states she was told Dr Carolin Coy would not fill this Rx flexeril with seeing her first.  That is why she is coming in.

## 2020-01-22 NOTE — Telephone Encounter (Signed)
Pt is coming in to RF flexeril. Pt was denied RF because she hasnt had this medication since 2017. So she made an appt to be seen for this medication.  KP

## 2020-01-23 ENCOUNTER — Other Ambulatory Visit: Payer: Self-pay | Admitting: Internal Medicine

## 2020-01-23 ENCOUNTER — Ambulatory Visit (INDEPENDENT_AMBULATORY_CARE_PROVIDER_SITE_OTHER): Payer: Medicare Other | Admitting: Internal Medicine

## 2020-01-23 ENCOUNTER — Other Ambulatory Visit: Payer: Self-pay

## 2020-01-23 ENCOUNTER — Encounter: Payer: Self-pay | Admitting: Internal Medicine

## 2020-01-23 VITALS — BP 112/70 | HR 92 | Temp 99.2°F | Ht 67.0 in | Wt 254.0 lb

## 2020-01-23 DIAGNOSIS — L4050 Arthropathic psoriasis, unspecified: Secondary | ICD-10-CM

## 2020-01-23 DIAGNOSIS — I10 Essential (primary) hypertension: Secondary | ICD-10-CM

## 2020-01-23 DIAGNOSIS — G2581 Restless legs syndrome: Secondary | ICD-10-CM

## 2020-01-23 DIAGNOSIS — R0609 Other forms of dyspnea: Secondary | ICD-10-CM

## 2020-01-23 MED ORDER — CYCLOBENZAPRINE HCL 10 MG PO TABS: 10.0000 mg | ORAL_TABLET | Freq: Every day | ORAL | 1 refills | Status: DC

## 2020-01-23 MED ORDER — ALBUTEROL SULFATE HFA 108 (90 BASE) MCG/ACT IN AERS: 2.0000 | INHALATION_SPRAY | Freq: Four times a day (QID) | RESPIRATORY_TRACT | 0 refills | Status: DC | PRN

## 2020-01-23 NOTE — Progress Notes (Signed)
Date:  01/23/2020   Name:  Diane Morris   DOB:  03-05-1951   MRN:  [id number]   Chief Complaint: restless leg (takes for restless less help her sleep, prescriped 3x a day but took once a day in the evening, [facility], does not go there anymore  been a year, llast time taking was last week )  Hypertension This is a chronic problem. The problem is controlled. Associated symptoms include shortness of breath. Pertinent negatives include no chest pain or headaches. Past treatments include ACE inhibitors, diuretics and beta blockers. The current treatment provides significant improvement. There are no compliance problems.   Shortness of Breath This is a recurrent problem. The problem occurs intermittently. Associated symptoms include leg swelling. Pertinent negatives include no abdominal pain, chest pain, headaches or wheezing. (Due to obesity and deconditioning)  Restless leg syndrome - she has had RLS for years.  Has been taking flexeril from her previous MD - 10 mg a bedtime.  Ran out last week.  Sx are stable, feeling like her legs are vibrating.  No daytime sx of concern.   Lab Results  Component Value Date   CREATININE 0.79 12/08/2019   BUN 15 12/08/2019   NA 140 12/08/2019   K 5.2 12/08/2019   CL 99 12/08/2019   CO2 25 12/08/2019   Lab Results  Component Value Date   CHOL 148 08/06/2019   HDL 35 (L) 08/06/2019   LDLCALC 81 08/06/2019   TRIG 186 (H) 08/06/2019   CHOLHDL 4.2 08/06/2019   Lab Results  Component Value Date   TSH 2.110 08/06/2019   Lab Results  Component Value Date   HGBA1C 6.6 (H) 12/08/2019   Lab Results  Component Value Date   WBC 12.0 (H) 08/06/2019   HGB 15.5 08/06/2019   HCT 46.0 08/06/2019   MCV 84 08/06/2019   PLT 361 08/06/2019   Lab Results  Component Value Date   ALT 19 08/06/2019   AST 18 08/06/2019   ALKPHOS 230 (H) 08/06/2019   BILITOT 0.6 08/06/2019     Review of Systems  Constitutional: Negative for chills and fatigue.    Respiratory: Positive for shortness of breath. Negative for chest tightness and wheezing.   Cardiovascular: Positive for leg swelling. Negative for chest pain.  Gastrointestinal: Negative for abdominal pain and blood in stool.  Musculoskeletal: Positive for arthralgias, back pain, gait problem and myalgias (restless legs in the evening -).  Neurological: Negative for dizziness and headaches.  Psychiatric/Behavioral: Negative for dysphoric mood and sleep disturbance.    Patient Active Problem List   Diagnosis Date Noted  . Mood disorder ([place]) 12/08/2019  . Hepatic steatosis 12/07/2019  . Elevated alkaline phosphatase level 08/08/2019  . Hyperlipidemia associated with type 2 diabetes mellitus ([place]) 08/06/2019  . Essential hypertension 01/31/2019  . GERD without esophagitis 01/31/2019  . Type II diabetes mellitus with complication ([place]) 51/88/4166  . Morbid obesity ([place]) 01/31/2019  . Benign paroxysmal positional vertigo due to bilateral vestibular disorder 01/31/2019  . History of gastritis   . Vitamin D deficiency 02/19/2017  . Chronic low back pain (Primary Area of Pain) (L>R) 02/14/2017  . Chronic hip pain (Secondary Area of Pain) (L>R) 02/14/2017  . Chronic right shoulder pain (Tertiary Area of Pain) 02/14/2017  . Bilateral chronic knee pain (Fourth Area of Pain) (L>R) 02/14/2017  . Chronic neck pain 02/14/2017  . Chronic upper extremity pain 02/14/2017  . Chronic sacroiliac joint pain 02/14/2017  . Adenomatous polyp of colon   .  CTS (carpal tunnel syndrome) 08/28/2016  . Hepatitis B surface antigen positive 11/19/2015  . Enthesitis 11/19/2015  . Facet joint disease of lumbosacral region ([place]) 11/19/2015  . Chronic bilateral low back pain with left-sided sciatica 11/09/2015  . Psoriasis 11/09/2015  . Psoriatic arthritis ([place]) 11/09/2015  . Tendinitis of right foot 11/09/2015    Allergies  Allergen Reactions  . Bee Venom Swelling  . Contrast Media [Iodinated Diagnostic  Agents] Shortness Of Breath  . Codeine Nausea And Vomiting       . Shellfish Allergy Nausea And Vomiting, Swelling and Rash       . Sodium Clavulanate Diarrhea    Past Surgical History:  Procedure Laterality Date  . COLONOSCOPY    . COLONOSCOPY WITH PROPOFOL N/A 10/09/2016   Procedure: COLONOSCOPY WITH PROPOFOL;  Surgeon: [name], MD;  Location: [place];  Service: Endoscopy;  Laterality: N/A;  Diabetic - oral meds  . DILATION AND CURETTAGE OF UTERUS    . ESOPHAGOGASTRODUODENOSCOPY (EGD) WITH PROPOFOL N/A 01/03/2018   Procedure: ESOPHAGOGASTRODUODENOSCOPY (EGD) WITH PROPOFOL;  Surgeon: [name], MD;  Location: [place];  Service: Endoscopy;  Laterality: N/A;  Diabetic - oral meds  . INDUCED ABORTION    . POLYPECTOMY  10/09/2016   Procedure: POLYPECTOMY;  Surgeon: [name], MD;  Location: [place];  Service: Endoscopy;;  . TUBAL LIGATION      Social History   Tobacco Use  . Smoking status: Former Smoker    Packs/day: 1.00    Years: 18.00    Pack years: 18.00    Types: Cigarettes    Quit date: 06/12/2006    Years since quitting: 13.6  . Smokeless tobacco: Never Used  Vaping Use  . Vaping Use: Never used  Substance Use Topics  . Alcohol use: No  . Drug use: No     Medication list has been reviewed and updated.  Current Meds  Medication Sig  . atorvastatin (LIPITOR) 80 MG tablet Take 80 mg by mouth daily.   . cyclobenzaprine (FLEXERIL) 10 MG tablet 10 mg 3 (three) times daily as needed. Restless Leg- Usually at Bedtime.  Marland Kitchen FARXIGA 10 MG TABS tablet TAKE 1 TABLET BY MOUTH EVERY MORNING FOR DIABETES  . gabapentin (NEURONTIN) 300 MG capsule Take 300 mg by mouth daily. Sciatic Nerve Pain- taking only at bedtime.  Marland Kitchen lisinopril-hydrochlorothiazide (ZESTORETIC) 10-12.5 MG tablet TAKE 1 TABLET BY MOUTH EVERY MORNING FOR BLOOD PRESSURE  . loratadine (CLARITIN) 10 MG tablet Take 10 mg by mouth daily. OTC  . meclizine (ANTIVERT) 25 MG tablet  Take 25 mg by mouth 3 (three) times daily as needed for dizziness. OTC  . metoprolol succinate (TOPROL-XL) 100 MG 24 hr tablet TAKE 1 TABLET BY MOUTH EVERY DAY IN THE EVENING  . omeprazole (PRILOSEC) 20 MG capsule Take 20 mg by mouth daily. OTC  . OTEZLA 30 MG TABS Take 1 tablet by mouth daily.  Marland Kitchen venlafaxine XR (EFFEXOR XR) 37.5 MG 24 hr capsule Take 1 capsule (37.5 mg total) by mouth daily with breakfast.    PHQ 2/9 Scores 01/23/2020 12/08/2019 11/05/2019 08/06/2019  PHQ - 2 Score 0 4 0 0  PHQ- 9 Score 3 10 - 0    GAD 7 : Generalized Anxiety Score 01/23/2020 12/08/2019  Nervous, Anxious, on Edge 0 2  Control/stop worrying 0 2  Worry too much - different things 0 2  Trouble relaxing 2 2  Restless 0 2  Easily annoyed or irritable 1 2  Afraid -  awful might happen 0 2  Total GAD 7 Score 3 14  Anxiety Difficulty Not difficult at all Somewhat difficult    BP Readings from Last 3 Encounters:  01/23/20 112/70  12/08/19 112/76  08/06/19 118/76    Physical Exam Vitals and nursing note reviewed.  Constitutional:      General: She is not in acute distress.    Appearance: She is well-developed.  HENT:     Head: Normocephalic and atraumatic.  Cardiovascular:     Rate and Rhythm: Normal rate and regular rhythm.     Pulses: Normal pulses.     Heart sounds: No murmur heard.   Pulmonary:     Effort: Pulmonary effort is normal. No respiratory distress.     Breath sounds: No wheezing or rhonchi.  Skin:    General: Skin is warm and dry.     Findings: No rash.  Neurological:     General: No focal deficit present.     Mental Status: She is alert and oriented to person, place, and time.  Psychiatric:        Mood and Affect: Mood normal.     Wt Readings from Last 3 Encounters:  01/23/20 254 lb (115.2 kg)  12/08/19 258 lb (117 kg)  08/06/19 254 lb (115.2 kg)    BP 112/70   Pulse 92   Temp 99.2 F (37.3 C) (Oral)   Ht 5\' 7"  (1.702 m)   Wt 254 lb (115.2 kg)   SpO2 97%   BMI 39.78  kg/m   Assessment and Plan: 1. Restless leg syndrome Continue flexeril 10 mg qhs - cyclobenzaprine (FLEXERIL) 10 MG tablet; Take 1 tablet (10 mg total) by mouth at bedtime. Restless Leg- Usually at Bedtime.  Dispense: 90 tablet; Refill: 1  2. Essential hypertension Clinically stable exam with well controlled BP on current medications. Tolerating medications without side effects at this time. Pt to continue current regimen and low sodium diet; benefits of regular exercise as able discussed.  3. Other form of dyspnea - albuterol (VENTOLIN HFA) 108 (90 Base) MCG/ACT inhaler; Inhale 2 puffs into the lungs every 6 (six) hours as needed for wheezing or shortness of breath.  Dispense: 6.7 g; Refill: 0   Partially dictated using Editor, commissioning. Any errors are unintentional.  Halina Maidens, MD [facility]  01/23/2020

## 2020-01-23 NOTE — Progress Notes (Unsigned)
Date:  01/23/2020   Name:  Diane Morris   DOB:  03/09/1951   MRN:  [id number]   Chief Complaint: No chief complaint on file.  HPI  Lab Results  Component Value Date   CREATININE 0.79 12/08/2019   BUN 15 12/08/2019   NA 140 12/08/2019   K 5.2 12/08/2019   CL 99 12/08/2019   CO2 25 12/08/2019   Lab Results  Component Value Date   CHOL 148 08/06/2019   HDL 35 (L) 08/06/2019   LDLCALC 81 08/06/2019   TRIG 186 (H) 08/06/2019   CHOLHDL 4.2 08/06/2019   Lab Results  Component Value Date   TSH 2.110 08/06/2019   Lab Results  Component Value Date   HGBA1C 6.6 (H) 12/08/2019   Lab Results  Component Value Date   WBC 12.0 (H) 08/06/2019   HGB 15.5 08/06/2019   HCT 46.0 08/06/2019   MCV 84 08/06/2019   PLT 361 08/06/2019   Lab Results  Component Value Date   ALT 19 08/06/2019   AST 18 08/06/2019   ALKPHOS 230 (H) 08/06/2019   BILITOT 0.6 08/06/2019     Review of Systems  Patient Active Problem List   Diagnosis Date Noted  . Mood disorder ([place]) 12/08/2019  . Hepatic steatosis 12/07/2019  . Elevated alkaline phosphatase level 08/08/2019  . Hyperlipidemia associated with type 2 diabetes mellitus ([place]) 08/06/2019  . Essential hypertension 01/31/2019  . GERD without esophagitis 01/31/2019  . Type II diabetes mellitus with complication ([place]) 40/98/1191  . Morbid obesity ([place]) 01/31/2019  . Benign paroxysmal positional vertigo due to bilateral vestibular disorder 01/31/2019  . History of gastritis   . Vitamin D deficiency 02/19/2017  . Chronic low back pain (Primary Area of Pain) (L>R) 02/14/2017  . Chronic hip pain (Secondary Area of Pain) (L>R) 02/14/2017  . Chronic right shoulder pain (Tertiary Area of Pain) 02/14/2017  . Bilateral chronic knee pain (Fourth Area of Pain) (L>R) 02/14/2017  . Chronic neck pain 02/14/2017  . Chronic upper extremity pain 02/14/2017  . Chronic sacroiliac joint pain 02/14/2017  . Adenomatous polyp of colon   . CTS (carpal  tunnel syndrome) 08/28/2016  . Hepatitis B surface antigen positive 11/19/2015  . Enthesitis 11/19/2015  . Facet joint disease of lumbosacral region ([place]) 11/19/2015  . Chronic bilateral low back pain with left-sided sciatica 11/09/2015  . Psoriasis 11/09/2015  . Psoriatic arthritis ([place]) 11/09/2015  . Tendinitis of right foot 11/09/2015    Allergies  Allergen Reactions  . Bee Venom Swelling  . Contrast Media [Iodinated Diagnostic Agents] Shortness Of Breath  . Codeine Nausea And Vomiting       . Shellfish Allergy Nausea And Vomiting, Swelling and Rash       . Sodium Clavulanate Diarrhea    Past Surgical History:  Procedure Laterality Date  . COLONOSCOPY    . COLONOSCOPY WITH PROPOFOL N/A 10/09/2016   Procedure: COLONOSCOPY WITH PROPOFOL;  Surgeon: [name], MD;  Location: [place];  Service: Endoscopy;  Laterality: N/A;  Diabetic - oral meds  . DILATION AND CURETTAGE OF UTERUS    . ESOPHAGOGASTRODUODENOSCOPY (EGD) WITH PROPOFOL N/A 01/03/2018   Procedure: ESOPHAGOGASTRODUODENOSCOPY (EGD) WITH PROPOFOL;  Surgeon: [name], MD;  Location: [place];  Service: Endoscopy;  Laterality: N/A;  Diabetic - oral meds  . INDUCED ABORTION    . POLYPECTOMY  10/09/2016   Procedure: POLYPECTOMY;  Surgeon: [name], MD;  Location: [place];  Service: Endoscopy;;  . TUBAL  LIGATION      Social History   Tobacco Use  . Smoking status: Former Smoker    Packs/day: 1.00    Years: 18.00    Pack years: 18.00    Types: Cigarettes    Quit date: 06/12/2006    Years since quitting: 13.6  . Smokeless tobacco: Never Used  Vaping Use  . Vaping Use: Never used  Substance Use Topics  . Alcohol use: No  . Drug use: No     Medication list has been reviewed and updated.  No outpatient medications have been marked as taking for the 01/23/20 encounter (Orders Only) with Glean Hess, MD.    [facility] 2/9 Scores 12/08/2019 11/05/2019 08/06/2019 07/04/2019  PHQ - 2  Score 4 0 0 2  PHQ- 9 Score 10 - 0 2    GAD 7 : Generalized Anxiety Score 12/08/2019  Nervous, Anxious, on Edge 2  Control/stop worrying 2  Worry too much - different things 2  Trouble relaxing 2  Restless 2  Easily annoyed or irritable 2  Afraid - awful might happen 2  Total GAD 7 Score 14  Anxiety Difficulty Somewhat difficult    BP Readings from Last 3 Encounters:  12/08/19 112/76  08/06/19 118/76  07/04/19 124/78    Physical Exam  Wt Readings from Last 3 Encounters:  12/08/19 258 lb (117 kg)  08/06/19 254 lb (115.2 kg)  07/04/19 253 lb (114.8 kg)    There were no vitals taken for this visit.  Assessment and Plan:

## 2020-01-29 ENCOUNTER — Other Ambulatory Visit: Payer: Self-pay | Admitting: Internal Medicine

## 2020-01-29 DIAGNOSIS — H8113 Benign paroxysmal vertigo, bilateral: Secondary | ICD-10-CM

## 2020-01-29 NOTE — Telephone Encounter (Signed)
Requested medication (s) are due for refill today: yes  Requested medication (s) are on the active medication list:yes  Last refill: 12/08/19  #30  2 refills  Future visit scheduled: yes  Notes to clinic:  Pharmacy requesting Dx code and 90 day rx    Requested Prescriptions  Pending Prescriptions Disp Refills   venlafaxine XR (EFFEXOR-XR) 37.5 MG 24 hr capsule [Pharmacy Med Name: VENLAFAXINE HCL ER 37.5 MG CAP] 90 capsule 1    Sig: TAKE 1 CAPSULE BY MOUTH DAILY WITH BREAKFAST.      Psychiatry: Antidepressants - SNRI - desvenlafaxine & venlafaxine Failed - 01/29/2020  9:31 AM      Failed - LDL in normal range and within 360 days    LDL Chol Calc (NIH)  Date Value Ref Range Status  08/06/2019 81 0 - 99 mg/dL Final          Failed - Triglycerides in normal range and within 360 days    Triglycerides  Date Value Ref Range Status  08/06/2019 186 (H) 0 - 149 mg/dL Final          Passed - Total Cholesterol in normal range and within 360 days    Cholesterol, Total  Date Value Ref Range Status  08/06/2019 148 100 - 199 mg/dL Final          Passed - Last BP in normal range    BP Readings from Last 1 Encounters:  01/23/20 112/70          Passed - Valid encounter within last 6 months    Recent Outpatient Visits           6 days ago Restless leg syndrome   [facility] Glean Hess, MD   1 month ago Type II diabetes mellitus with complication [facility])   [facility] Glean Hess, MD   5 months ago Annual physical exam   [facility] Glean Hess, MD   6 months ago Essential hypertension   [facility] Glean Hess, MD   12 months ago Essential hypertension   [facility] Glean Hess, MD       Future Appointments             In 2 months Army Melia Jesse Sans, MD [facility], [place]   In 6 months Army Melia, Jesse Sans, MD [facility], [facility]

## 2020-02-26 ENCOUNTER — Other Ambulatory Visit: Payer: Self-pay | Admitting: Internal Medicine

## 2020-02-26 DIAGNOSIS — H8113 Benign paroxysmal vertigo, bilateral: Secondary | ICD-10-CM

## 2020-02-26 NOTE — Telephone Encounter (Signed)
Pharmacy requesting changed to original Rx- 90 day Rx requested- patient has f/u 10/28 for this medication

## 2020-03-10 ENCOUNTER — Ambulatory Visit (INDEPENDENT_AMBULATORY_CARE_PROVIDER_SITE_OTHER): Payer: Medicare Other | Admitting: Internal Medicine

## 2020-03-10 ENCOUNTER — Encounter: Payer: Self-pay | Admitting: Internal Medicine

## 2020-03-10 ENCOUNTER — Other Ambulatory Visit: Payer: Self-pay

## 2020-03-10 VITALS — BP 126/78 | HR 93 | Ht 67.0 in | Wt 251.0 lb

## 2020-03-10 DIAGNOSIS — E118 Type 2 diabetes mellitus with unspecified complications: Secondary | ICD-10-CM

## 2020-03-10 DIAGNOSIS — Z23 Encounter for immunization: Secondary | ICD-10-CM

## 2020-03-10 DIAGNOSIS — H8113 Benign paroxysmal vertigo, bilateral: Secondary | ICD-10-CM | POA: Diagnosis not present

## 2020-03-10 DIAGNOSIS — I10 Essential (primary) hypertension: Secondary | ICD-10-CM

## 2020-03-10 DIAGNOSIS — L738 Other specified follicular disorders: Secondary | ICD-10-CM

## 2020-03-10 MED ORDER — SULFAMETHOXAZOLE-TRIMETHOPRIM 800-160 MG PO TABS: 1.0000 | ORAL_TABLET | Freq: Two times a day (BID) | ORAL | 0 refills | Status: AC

## 2020-03-10 MED ORDER — VENLAFAXINE HCL ER 37.5 MG PO CP24: 37.5000 mg | ORAL_CAPSULE | Freq: Every day | ORAL | 0 refills | Status: DC

## 2020-03-10 NOTE — Progress Notes (Signed)
Date:  03/10/2020   Name:  Diane Morris   DOB:  Oct 22, 1950   MRN:  [id number]   Chief Complaint: Hypertension (High dose flu shot.), Diabetes (Foot Exam. ), and Rash (Rash on stomach- very itchy and sometimes sore. X 1 week. Tried gold bond poweder, benadryl itch spray, and exederm cream. )  Diabetes She presents for her follow-up diabetic visit. She has type 2 diabetes mellitus. Hypoglycemia symptoms include dizziness. Pertinent negatives for hypoglycemia include no nervousness/anxiousness. Associated symptoms include weight loss (7 lbs since June). Pertinent negatives for diabetes include no blurred vision, no chest pain, no fatigue, no foot paresthesias and no weakness. Current diabetic treatment includes oral agent (monotherapy) (farxiga). An ACE inhibitor/angiotensin II receptor blocker is being taken.  Hypertension This is a chronic problem. The problem is controlled. Pertinent negatives include no blurred vision, chest pain, palpitations or shortness of breath. Past treatments include calcium channel blockers, ACE inhibitors and diuretics. The current treatment provides significant improvement.  Rash This is a new problem. The current episode started in the past 7 days. The problem has been gradually worsening since onset. The affected locations include the abdomen. The rash is characterized by redness and itchiness. She was exposed to nothing. Pertinent negatives include no cough, fatigue, fever or shortness of breath. Past treatments include anti-itch cream. The treatment provided no relief.    Lab Results  Component Value Date   CREATININE 0.79 12/08/2019   BUN 15 12/08/2019   NA 140 12/08/2019   K 5.2 12/08/2019   CL 99 12/08/2019   CO2 25 12/08/2019   Lab Results  Component Value Date   CHOL 148 08/06/2019   HDL 35 (L) 08/06/2019   LDLCALC 81 08/06/2019   TRIG 186 (H) 08/06/2019   CHOLHDL 4.2 08/06/2019   Lab Results  Component Value Date   TSH 2.110 08/06/2019    Lab Results  Component Value Date   HGBA1C 6.6 (H) 12/08/2019   Lab Results  Component Value Date   WBC 12.0 (H) 08/06/2019   HGB 15.5 08/06/2019   HCT 46.0 08/06/2019   MCV 84 08/06/2019   PLT 361 08/06/2019   Lab Results  Component Value Date   ALT 19 08/06/2019   AST 18 08/06/2019   ALKPHOS 230 (H) 08/06/2019   BILITOT 0.6 08/06/2019     Review of Systems  Constitutional: Positive for weight loss (7 lbs since June). Negative for chills, diaphoresis, fatigue and fever.  Eyes: Negative for blurred vision.  Respiratory: Negative for cough, chest tightness, shortness of breath and wheezing.   Cardiovascular: Negative for chest pain and palpitations.  Gastrointestinal: Negative for constipation.  Musculoskeletal: Negative for arthralgias.  Skin: Positive for rash.  Neurological: Positive for dizziness. Negative for weakness, light-headedness and numbness.  Psychiatric/Behavioral: Negative for dysphoric mood and sleep disturbance. The patient is not nervous/anxious.     Patient Active Problem List   Diagnosis Date Noted  . Mood disorder ([place]) 12/08/2019  . Hepatic steatosis 12/07/2019  . Elevated alkaline phosphatase level 08/08/2019  . Hyperlipidemia associated with type 2 diabetes mellitus ([place]) 08/06/2019  . Essential hypertension 01/31/2019  . GERD without esophagitis 01/31/2019  . Type II diabetes mellitus with complication ([place]) 76/22/6333  . Morbid obesity ([place]) 01/31/2019  . Benign paroxysmal positional vertigo due to bilateral vestibular disorder 01/31/2019  . History of gastritis   . Vitamin D deficiency 02/19/2017  . Chronic low back pain (Primary Area of Pain) (L>R) 02/14/2017  . Chronic hip pain (Secondary  Area of Pain) (L>R) 02/14/2017  . Chronic right shoulder pain (Tertiary Area of Pain) 02/14/2017  . Bilateral chronic knee pain (Fourth Area of Pain) (L>R) 02/14/2017  . Chronic neck pain 02/14/2017  . Chronic upper extremity pain 02/14/2017  .  Chronic sacroiliac joint pain 02/14/2017  . Adenomatous polyp of colon   . CTS (carpal tunnel syndrome) 08/28/2016  . Hepatitis B surface antigen positive 11/19/2015  . Enthesitis 11/19/2015  . Facet joint disease of lumbosacral region ([place]) 11/19/2015  . Chronic bilateral low back pain with left-sided sciatica 11/09/2015  . Psoriasis 11/09/2015  . Psoriatic arthritis ([place]) 11/09/2015  . Tendinitis of right foot 11/09/2015    Allergies  Allergen Reactions  . Bee Venom Swelling  . Contrast Media [Iodinated Diagnostic Agents] Shortness Of Breath  . Codeine Nausea And Vomiting       . Shellfish Allergy Nausea And Vomiting, Swelling and Rash       . Sodium Clavulanate Diarrhea    Past Surgical History:  Procedure Laterality Date  . COLONOSCOPY    . COLONOSCOPY WITH PROPOFOL N/A 10/09/2016   Procedure: COLONOSCOPY WITH PROPOFOL;  Surgeon: [name], MD;  Location: [place];  Service: Endoscopy;  Laterality: N/A;  Diabetic - oral meds  . DILATION AND CURETTAGE OF UTERUS    . ESOPHAGOGASTRODUODENOSCOPY (EGD) WITH PROPOFOL N/A 01/03/2018   Procedure: ESOPHAGOGASTRODUODENOSCOPY (EGD) WITH PROPOFOL;  Surgeon: [name], MD;  Location: [place];  Service: Endoscopy;  Laterality: N/A;  Diabetic - oral meds  . INDUCED ABORTION    . POLYPECTOMY  10/09/2016   Procedure: POLYPECTOMY;  Surgeon: [name], MD;  Location: [place];  Service: Endoscopy;;  . TUBAL LIGATION      Social History   Tobacco Use  . Smoking status: Former Smoker    Packs/day: 1.00    Years: 18.00    Pack years: 18.00    Types: Cigarettes    Quit date: 06/12/2006    Years since quitting: 13.7  . Smokeless tobacco: Never Used  Vaping Use  . Vaping Use: Never used  Substance Use Topics  . Alcohol use: No  . Drug use: No     Medication list has been reviewed and updated.  Current Meds  Medication Sig  . albuterol (VENTOLIN HFA) 108 (90 Base) MCG/ACT inhaler Inhale 2  puffs into the lungs every 6 (six) hours as needed for wheezing or shortness of breath.  Marland Kitchen atorvastatin (LIPITOR) 80 MG tablet Take 80 mg by mouth daily.   . cyclobenzaprine (FLEXERIL) 10 MG tablet Take 1 tablet (10 mg total) by mouth at bedtime. Restless Leg- Usually at Bedtime.  Marland Kitchen FARXIGA 10 MG TABS tablet TAKE 1 TABLET BY MOUTH EVERY MORNING FOR DIABETES  . gabapentin (NEURONTIN) 300 MG capsule Take 300 mg by mouth daily. Sciatic Nerve Pain- taking only at bedtime.  Marland Kitchen lisinopril-hydrochlorothiazide (ZESTORETIC) 10-12.5 MG tablet TAKE 1 TABLET BY MOUTH EVERY MORNING FOR BLOOD PRESSURE  . loratadine (CLARITIN) 10 MG tablet Take 10 mg by mouth daily. OTC  . meclizine (ANTIVERT) 25 MG tablet Take 12.5 mg by mouth daily. OTC daily.  . metoprolol succinate (TOPROL-XL) 100 MG 24 hr tablet TAKE 1 TABLET BY MOUTH EVERY DAY IN THE EVENING  . omeprazole (PRILOSEC) 20 MG capsule Take 20 mg by mouth daily. OTC  . OTEZLA 30 MG TABS Take 1 tablet by mouth daily.  Marland Kitchen venlafaxine XR (EFFEXOR-XR) 37.5 MG 24 hr capsule TAKE 1 CAPSULE BY MOUTH DAILY WITH BREAKFAST.  PHQ 2/9 Scores 03/10/2020 01/23/2020 12/08/2019 11/05/2019  PHQ - 2 Score 2 0 4 0  PHQ- 9 Score 4 3 10  -    GAD 7 : Generalized Anxiety Score 03/10/2020 01/23/2020 12/08/2019  Nervous, Anxious, on Edge 0 0 2  Control/stop worrying 0 0 2  Worry too much - different things 0 0 2  Trouble relaxing 0 2 2  Restless 0 0 2  Easily annoyed or irritable 0 1 2  Afraid - awful might happen 0 0 2  Total GAD 7 Score 0 3 14  Anxiety Difficulty Not difficult at all Not difficult at all Somewhat difficult    BP Readings from Last 3 Encounters:  03/10/20 126/78  01/23/20 112/70  12/08/19 112/76    Physical Exam Vitals and nursing note reviewed.  Constitutional:      General: She is not in acute distress.    Appearance: She is well-developed.  HENT:     Head: Normocephalic and atraumatic.  Neck:     Vascular: No carotid bruit.  Cardiovascular:      Rate and Rhythm: Normal rate and regular rhythm.     Pulses: Normal pulses.  Pulmonary:     Effort: Pulmonary effort is normal. No respiratory distress.     Breath sounds: No wheezing or rhonchi.  Musculoskeletal:     Cervical back: Normal range of motion.     Right lower leg: No edema.     Left lower leg: No edema.  Lymphadenopathy:     Cervical: No cervical adenopathy.  Skin:    General: Skin is warm and dry.     Capillary Refill: Capillary refill takes less than 2 seconds.     Findings: Rash present.     Comments: Scattered papular rash over abdomen with excoriations  Neurological:     General: No focal deficit present.     Mental Status: She is alert and oriented to person, place, and time.  Psychiatric:        Behavior: Behavior normal.        Thought Content: Thought content normal.     Wt Readings from Last 3 Encounters:  03/10/20 251 lb (113.9 kg)  01/23/20 254 lb (115.2 kg)  12/08/19 258 lb (117 kg)    BP 126/78 (BP Location: Right Arm, Patient Position: Sitting, Cuff Size: Large)   Pulse 93   Ht 5\' 7"  (1.702 m)   Wt 251 lb (113.9 kg)   SpO2 97%   BMI 39.31 kg/m   Assessment and Plan: 1. Type II diabetes mellitus with complication (HCC) Clinically stable by exam and report without s/s of hypoglycemia. DM complicated by HTN. Tolerating medications - farxiga -  well without side effects or other concerns. - Hemoglobin A1c  2. Essential hypertension Clinically stable exam with well controlled BP. Tolerating medications without side effects at this time. Pt to continue current regimen; benefits of regular exercise as able discussed.  3. Bacterial folliculitis Recommend cortisone cream as needed to itching areas If no improvement, will need Dermatology evaluation - sulfamethoxazole-trimethoprim (BACTRIM DS) 800-160 MG tablet; Take 1 tablet by mouth 2 (two) times daily for 10 days.  Dispense: 20 tablet; Refill: 0  4. Benign paroxysmal positional vertigo due  to bilateral vestibular disorder Improved with low dose effexor and low dose meclizine - venlafaxine XR (EFFEXOR-XR) 37.5 MG 24 hr capsule; Take 1 capsule (37.5 mg total) by mouth daily with breakfast.  Dispense: 90 capsule; Refill: 0   Partially dictated using Editor, commissioning.  Any errors are unintentional.  Halina Maidens, MD [facility]  03/10/2020

## 2020-03-11 LAB — HEMOGLOBIN A1C
Est. average glucose Bld gHb Est-mCnc: 146 mg/dL
Hgb A1c MFr Bld: 6.7 % — ABNORMAL HIGH (ref 4.8–5.6)

## 2020-04-08 ENCOUNTER — Ambulatory Visit: Payer: Medicare Other | Admitting: Internal Medicine

## 2020-04-15 ENCOUNTER — Other Ambulatory Visit: Payer: Self-pay | Admitting: Internal Medicine

## 2020-04-18 ENCOUNTER — Other Ambulatory Visit: Payer: Self-pay | Admitting: Internal Medicine

## 2020-04-18 NOTE — Telephone Encounter (Signed)
Requested Prescriptions  Pending Prescriptions Disp Refills  . lisinopril-hydrochlorothiazide (ZESTORETIC) 10-12.5 MG tablet [Pharmacy Med Name: LISINOPRIL-HCTZ 10-12.5 MG TAB] 90 tablet 1    Sig: TAKE 1 TABLET BY MOUTH EVERY MORNING FOR BLOOD PRESSURE     Cardiovascular:  ACEI + Diuretic Combos Passed - 04/18/2020  9:22 AM      Passed - Na in normal range and within 180 days    Sodium  Date Value Ref Range Status  12/08/2019 140 134 - 144 mmol/L Final         Passed - K in normal range and within 180 days    Potassium  Date Value Ref Range Status  12/08/2019 5.2 3.5 - 5.2 mmol/L Final         Passed - Cr in normal range and within 180 days    Creatinine, Ser  Date Value Ref Range Status  12/08/2019 0.79 0.57 - 1.00 mg/dL Final         Passed - Ca in normal range and within 180 days    Calcium  Date Value Ref Range Status  12/08/2019 10.1 8.7 - 10.3 mg/dL Final         Passed - Patient is not pregnant      Passed - Last BP in normal range    BP Readings from Last 1 Encounters:  03/10/20 126/78         Passed - Valid encounter within last 6 months    Recent Outpatient Visits          1 month ago Type II diabetes mellitus with complication [facility])   [facility] Glean Hess, MD   2 months ago Restless leg syndrome   [facility] Glean Hess, MD   4 months ago Type II diabetes mellitus with complication [facility])   [facility] Glean Hess, MD   8 months ago Annual physical exam   [facility] Glean Hess, MD   9 months ago Essential hypertension   [facility] Glean Hess, MD      Future Appointments            In 3 months Army Melia Jesse Sans, MD [facility], [facility]

## 2020-04-23 ENCOUNTER — Other Ambulatory Visit: Payer: Self-pay | Admitting: Internal Medicine

## 2020-06-23 ENCOUNTER — Other Ambulatory Visit: Payer: Self-pay | Admitting: Internal Medicine

## 2020-06-23 DIAGNOSIS — H8113 Benign paroxysmal vertigo, bilateral: Secondary | ICD-10-CM

## 2020-07-12 ENCOUNTER — Other Ambulatory Visit: Payer: Self-pay | Admitting: Internal Medicine

## 2020-07-12 DIAGNOSIS — H8113 Benign paroxysmal vertigo, bilateral: Secondary | ICD-10-CM

## 2020-07-12 DIAGNOSIS — R0609 Other forms of dyspnea: Secondary | ICD-10-CM

## 2020-07-12 DIAGNOSIS — G2581 Restless legs syndrome: Secondary | ICD-10-CM

## 2020-08-06 ENCOUNTER — Other Ambulatory Visit: Payer: Self-pay

## 2020-08-06 ENCOUNTER — Ambulatory Visit (INDEPENDENT_AMBULATORY_CARE_PROVIDER_SITE_OTHER): Payer: Medicare Other | Admitting: Internal Medicine

## 2020-08-06 ENCOUNTER — Encounter: Payer: Self-pay | Admitting: Internal Medicine

## 2020-08-06 VITALS — BP 112/72 | HR 79 | Temp 97.7°F | Ht 67.0 in | Wt 257.0 lb

## 2020-08-06 DIAGNOSIS — Z1231 Encounter for screening mammogram for malignant neoplasm of breast: Secondary | ICD-10-CM | POA: Diagnosis not present

## 2020-08-06 DIAGNOSIS — E1169 Type 2 diabetes mellitus with other specified complication: Secondary | ICD-10-CM

## 2020-08-06 DIAGNOSIS — E785 Hyperlipidemia, unspecified: Secondary | ICD-10-CM

## 2020-08-06 DIAGNOSIS — E118 Type 2 diabetes mellitus with unspecified complications: Secondary | ICD-10-CM

## 2020-08-06 DIAGNOSIS — F39 Unspecified mood [affective] disorder: Secondary | ICD-10-CM

## 2020-08-06 DIAGNOSIS — I10 Essential (primary) hypertension: Secondary | ICD-10-CM | POA: Diagnosis not present

## 2020-08-06 DIAGNOSIS — Z6841 Body Mass Index (BMI) 40.0 and over, adult: Secondary | ICD-10-CM

## 2020-08-06 DIAGNOSIS — Z23 Encounter for immunization: Secondary | ICD-10-CM

## 2020-08-06 DIAGNOSIS — E2839 Other primary ovarian failure: Secondary | ICD-10-CM

## 2020-08-06 DIAGNOSIS — L4050 Arthropathic psoriasis, unspecified: Secondary | ICD-10-CM

## 2020-08-06 MED ORDER — DAPAGLIFLOZIN PROPANEDIOL 10 MG PO TABS: 10.0000 mg | ORAL_TABLET | Freq: Every day | ORAL | 1 refills | Status: DC

## 2020-08-06 MED ORDER — METOPROLOL SUCCINATE ER 100 MG PO TB24: 100.0000 mg | ORAL_TABLET | Freq: Every evening | ORAL | 1 refills | Status: DC

## 2020-08-06 MED ORDER — LISINOPRIL-HYDROCHLOROTHIAZIDE 10-12.5 MG PO TABS: 1.0000 | ORAL_TABLET | Freq: Every day | ORAL | 1 refills | Status: DC

## 2020-08-06 NOTE — Progress Notes (Signed)
Date:  08/06/2020   Name:  Diane Morris   DOB:  [birth year]   MRN:  [id number]   Chief Complaint: Annual Exam (Breast exam no pap)  Diane Morris is a 70 y.o. female who presents today for her Complete Annual Exam. She feels fairly well. She reports exercising rarely. She reports she is sleeping fairly well. Breast complaints none.  Mammogram: 03/2019 DEXA: none Pap smear: discontinued Colonoscopy: 09/2016 repeat 2023  Immunization History  Administered Date(s) Administered  . Fluad Quad(high Dose 65+) 03/10/2020  . Moderna Sars-Covid-2 Vaccination 09/24/2019, 10/22/2019, 04/26/2020    Hypertension This is a chronic problem. The problem is controlled. Pertinent negatives include no chest pain, headaches, palpitations or shortness of breath. Past treatments include beta blockers, ACE inhibitors and diuretics. The current treatment provides significant improvement. There are no compliance problems.  There is no history of kidney disease, CAD/MI or CVA.  Diabetes She presents for her follow-up diabetic visit. She has type 2 diabetes mellitus. Her disease course has been stable. Pertinent negatives for hypoglycemia include no dizziness, headaches, nervousness/anxiousness or tremors. Pertinent negatives for diabetes include no chest pain, no fatigue, no polydipsia and no polyuria. Pertinent negatives for diabetic complications include no CVA. Current diabetic treatments: farxiga. An ACE inhibitor/angiotensin II receptor blocker is being taken. Eye exam is not current.  Hyperlipidemia This is a chronic problem. The problem is controlled. Pertinent negatives include no chest pain or shortness of breath. Current antihyperlipidemic treatment includes statins. The current treatment provides significant improvement of lipids.  Depression        Associated symptoms include no fatigue and no headaches.   Lab Results  Component Value Date   CREATININE 0.79 12/08/2019   BUN 15 12/08/2019    NA 140 12/08/2019   K 5.2 12/08/2019   CL 99 12/08/2019   CO2 25 12/08/2019   Lab Results  Component Value Date   CHOL 148 08/06/2019   HDL 35 (L) 08/06/2019   LDLCALC 81 08/06/2019   TRIG 186 (H) 08/06/2019   CHOLHDL 4.2 08/06/2019   Lab Results  Component Value Date   TSH 2.110 08/06/2019   Lab Results  Component Value Date   HGBA1C 6.7 (H) 03/10/2020   Lab Results  Component Value Date   WBC 12.0 (H) 08/06/2019   HGB 15.5 08/06/2019   HCT 46.0 08/06/2019   MCV 84 08/06/2019   PLT 361 08/06/2019   Lab Results  Component Value Date   ALT 19 08/06/2019   AST 18 08/06/2019   ALKPHOS 230 (H) 08/06/2019   BILITOT 0.6 08/06/2019     Review of Systems  Constitutional: Negative for chills, fatigue and fever.  HENT: Negative for congestion, hearing loss, tinnitus, trouble swallowing and voice change.   Eyes: Negative for visual disturbance.  Respiratory: Negative for cough, chest tightness, shortness of breath and wheezing.   Cardiovascular: Negative for chest pain, palpitations and leg swelling.  Gastrointestinal: Negative for abdominal pain, constipation, diarrhea and vomiting.  Endocrine: Negative for polydipsia and polyuria.  Genitourinary: Negative for dysuria, frequency, genital sores, vaginal bleeding and vaginal discharge.  Musculoskeletal: Negative for arthralgias, gait problem and joint swelling.  Skin: Negative for color change and rash.  Neurological: Negative for dizziness, tremors, light-headedness and headaches.  Hematological: Negative for adenopathy. Does not bruise/bleed easily.  Psychiatric/Behavioral: Positive for depression. Negative for dysphoric mood and sleep disturbance. The patient is not nervous/anxious.     Patient Active Problem List   Diagnosis Date Noted  . Mood  disorder ([place]) 12/08/2019  . Hepatic steatosis 12/07/2019  . Elevated alkaline phosphatase level 08/08/2019  . Hyperlipidemia associated with type 2 diabetes mellitus ([place])  08/06/2019  . Essential hypertension 01/31/2019  . GERD without esophagitis 01/31/2019  . Type II diabetes mellitus with complication ([place]) 27/08/5007  . Morbid obesity ([place]) 01/31/2019  . Benign paroxysmal positional vertigo due to bilateral vestibular disorder 01/31/2019  . History of gastritis   . Vitamin D deficiency 02/19/2017  . Chronic low back pain (Primary Area of Pain) (L>R) 02/14/2017  . Chronic hip pain (Secondary Area of Pain) (L>R) 02/14/2017  . Chronic right shoulder pain (Tertiary Area of Pain) 02/14/2017  . Bilateral chronic knee pain (Fourth Area of Pain) (L>R) 02/14/2017  . Chronic neck pain 02/14/2017  . Chronic upper extremity pain 02/14/2017  . Chronic sacroiliac joint pain 02/14/2017  . Adenomatous polyp of colon   . CTS (carpal tunnel syndrome) 08/28/2016  . Hepatitis B surface antigen positive 11/19/2015  . Enthesitis 11/19/2015  . Facet joint disease of lumbosacral region ([place]) 11/19/2015  . Chronic bilateral low back pain with left-sided sciatica 11/09/2015  . Psoriasis 11/09/2015  . Psoriatic arthritis ([place]) 11/09/2015  . Tendinitis of right foot 11/09/2015    Allergies  Allergen Reactions  . Bee Venom Swelling  . Contrast Media [Iodinated Diagnostic Agents] Shortness Of Breath  . Codeine Nausea And Vomiting       . Shellfish Allergy Nausea And Vomiting, Swelling and Rash       . Sodium Clavulanate Diarrhea    Past Surgical History:  Procedure Laterality Date  . COLONOSCOPY    . COLONOSCOPY WITH PROPOFOL N/A 10/09/2016   Procedure: COLONOSCOPY WITH PROPOFOL;  Surgeon: [name], MD;  Location: [facility];  Service: Endoscopy;  Laterality: N/A;  Diabetic - oral meds  . DILATION AND CURETTAGE OF UTERUS    . ESOPHAGOGASTRODUODENOSCOPY (EGD) WITH PROPOFOL N/A 01/03/2018   Procedure: ESOPHAGOGASTRODUODENOSCOPY (EGD) WITH PROPOFOL;  Surgeon: [name], MD;  Location: [facility];  Service: Endoscopy;  Laterality: N/A;  Diabetic -  oral meds  . INDUCED ABORTION    . POLYPECTOMY  10/09/2016   Procedure: POLYPECTOMY;  Surgeon: [name], MD;  Location: [facility];  Service: Endoscopy;;  . TUBAL LIGATION      Social History   Tobacco Use  . Smoking status: Former Smoker    Packs/day: 1.00    Years: 18.00    Pack years: 18.00    Types: Cigarettes    Quit date: 06/12/2006    Years since quitting: 14.1  . Smokeless tobacco: Never Used  Vaping Use  . Vaping Use: Never used  Substance Use Topics  . Alcohol use: No  . Drug use: No     Medication list has been reviewed and updated.  Current Meds  Medication Sig  . atorvastatin (LIPITOR) 80 MG tablet Take 80 mg by mouth daily.   . Cetirizine HCl (ALLERGY, CETIRIZINE, PO) Take by mouth.  . cyclobenzaprine (FLEXERIL) 10 MG tablet TAKE 1 TABLET (10 MG TOTAL) BY MOUTH AT BEDTIME. RESTLESS LEG- USUALLY AT BEDTIME.  Marland Kitchen gabapentin (NEURONTIN) 300 MG capsule Take 300 mg by mouth daily. Sciatic Nerve Pain- taking only at bedtime.  . Ibuprofen 200 MG CAPS Take by mouth.  Marland Kitchen lisinopril-hydrochlorothiazide (ZESTORETIC) 10-12.5 MG tablet TAKE 1 TABLET BY MOUTH EVERY MORNING FOR BLOOD PRESSURE  . meclizine (ANTIVERT) 25 MG tablet Take 12.5 mg by mouth daily. OTC daily.  . metoprolol succinate (TOPROL-XL) 100 MG 24  hr tablet TAKE 1 TABLET BY MOUTH EVERY DAY IN THE EVENING  . omeprazole (PRILOSEC) 20 MG capsule Take 20 mg by mouth daily. OTC  . OTEZLA 30 MG TABS Take 1 tablet by mouth daily.  Marland Kitchen venlafaxine XR (EFFEXOR-XR) 37.5 MG 24 hr capsule TAKE 1 CAPSULE BY MOUTH DAILY WITH BREAKFAST.  . VENTOLIN HFA 108 (90 Base) MCG/ACT inhaler TAKE 2 PUFFS BY MOUTH EVERY 6 HOURS AS NEEDED FOR WHEEZE OR SHORTNESS OF BREATH    PHQ 2/9 Scores 08/06/2020 03/10/2020 01/23/2020 12/08/2019  PHQ - 2 Score 4 2 0 4  PHQ- 9 Score 6 4 3 10     GAD 7 : Generalized Anxiety Score 08/06/2020 03/10/2020 01/23/2020 12/08/2019  Nervous, Anxious, on Edge 0 0 0 2  Control/stop worrying 0 0 0 2  Worry  too much - different things 0 0 0 2  Trouble relaxing 0 0 2 2  Restless 0 0 0 2  Easily annoyed or irritable 0 0 1 2  Afraid - awful might happen 0 0 0 2  Total GAD 7 Score 0 0 3 14  Anxiety Difficulty - Not difficult at all Not difficult at all Somewhat difficult    BP Readings from Last 3 Encounters:  08/06/20 112/72  03/10/20 126/78  01/23/20 112/70    Physical Exam Vitals and nursing note reviewed.  Constitutional:      General: She is not in acute distress.    Appearance: She is well-developed.  HENT:     Head: Normocephalic and atraumatic.     Right Ear: Tympanic membrane and ear canal normal.     Left Ear: Tympanic membrane and ear canal normal.     Nose:     Right Sinus: No maxillary sinus tenderness.     Left Sinus: No maxillary sinus tenderness.  Eyes:     General: No scleral icterus.       Right eye: No discharge.        Left eye: No discharge.     Conjunctiva/sclera: Conjunctivae normal.  Neck:     Thyroid: No thyromegaly.     Vascular: No carotid bruit.  Cardiovascular:     Rate and Rhythm: Normal rate and regular rhythm.     Pulses: Normal pulses.     Heart sounds: Normal heart sounds.  Pulmonary:     Effort: Pulmonary effort is normal. No respiratory distress.     Breath sounds: No wheezing.  Chest:  Breasts:     Right: No mass, nipple discharge, skin change or tenderness.     Left: No mass, nipple discharge, skin change or tenderness.    Abdominal:     General: Bowel sounds are normal.     Palpations: Abdomen is soft.     Tenderness: There is no abdominal tenderness.  Musculoskeletal:     Cervical back: Normal range of motion. No erythema.     Right lower leg: No edema.     Left lower leg: No edema.  Lymphadenopathy:     Cervical: No cervical adenopathy.  Skin:    General: Skin is warm and dry.     Findings: No rash.  Neurological:     General: No focal deficit present.     Mental Status: She is alert and oriented to person, place, and  time.     Cranial Nerves: No cranial nerve deficit.     Sensory: No sensory deficit.     Deep Tendon Reflexes: Reflexes are normal and symmetric.  Psychiatric:  Attention and Perception: Attention normal.        Mood and Affect: Mood normal.     Wt Readings from Last 3 Encounters:  08/06/20 257 lb (116.6 kg)  03/10/20 251 lb (113.9 kg)  01/23/20 254 lb (115.2 kg)    BP 112/72   Pulse 79   Temp 97.7 F (36.5 C) (Oral)   Ht 5\' 7"  (1.702 m)   Wt 257 lb (116.6 kg)   SpO2 98%   BMI 40.25 kg/m   Assessment and Plan: 1. Type II diabetes mellitus with complication (HCC) Clinically stable by exam and report without s/s of hypoglycemia. DM complicated by HTN. Tolerating medications well without side effects or other concerns. - Comprehensive metabolic panel - Hemoglobin A1c - dapagliflozin propanediol (FARXIGA) 10 MG TABS tablet; Take 1 tablet (10 mg total) by mouth daily.  Dispense: 90 tablet; Refill: 1  2. Encounter for screening mammogram for breast cancer Schedule with DEXA - MM 3D SCREEN BREAST BILATERAL; Future  3. Essential hypertension Clinically stable exam with well controlled BP. Tolerating medications without side effects at this time. Pt to continue current regimen and low sodium diet; benefits of regular exercise as able discussed.  Pt has a treadmill at home - recommend starting slow and working up as endurance increases - CBC with Differential/Platelet - lisinopril-hydrochlorothiazide (ZESTORETIC) 10-12.5 MG tablet; Take 1 tablet by mouth daily.  Dispense: 90 tablet; Refill: 1 - metoprolol succinate (TOPROL-XL) 100 MG 24 hr tablet; Take 1 tablet (100 mg total) by mouth every evening. Take with or immediately following a meal.  Dispense: 90 tablet; Refill: 1  4. Hyperlipidemia associated with type 2 diabetes mellitus ([place]) On high intensity statin - aim for LDL < 70 - Lipid panel  5. Ovarian failure - DG Bone Density; Future  6. Need for vaccination  for pneumococcus - Pneumococcal conjugate vaccine 13-valent IM  7. Psoriatic arthritis ([place]) Being followed by [facility] with good results  8. Mood disorder (HCC) Clinically stable on current regimen with good control of symptoms, No SI or HI. Will continue current therapy. Slightly sad today due to passing of her dog 2 weeks ago  9. BMI 40.0-44.9, adult [facility]) Exercise and diet changes discussed   Partially dictated using Editor, commissioning. Any errors are unintentional.  Halina Maidens, MD [facility]  08/06/2020

## 2020-08-07 LAB — CBC WITH DIFFERENTIAL/PLATELET
Basophils Absolute: 0.1 10*3/uL (ref 0.0–0.2)
Basos: 1 %
EOS (ABSOLUTE): 0.3 10*3/uL (ref 0.0–0.4)
Eos: 3 %
Hematocrit: 45.3 % (ref 34.0–46.6)
Hemoglobin: 15.1 g/dL (ref 11.1–15.9)
Immature Grans (Abs): 0.1 10*3/uL (ref 0.0–0.1)
Immature Granulocytes: 1 %
Lymphocytes Absolute: 3.3 10*3/uL — ABNORMAL HIGH (ref 0.7–3.1)
Lymphs: 28 %
MCH: 28.3 pg (ref 26.6–33.0)
MCHC: 33.3 g/dL (ref 31.5–35.7)
MCV: 85 fL (ref 79–97)
Monocytes Absolute: 0.6 10*3/uL (ref 0.1–0.9)
Monocytes: 5 %
Neutrophils Absolute: 7.1 10*3/uL — ABNORMAL HIGH (ref 1.4–7.0)
Neutrophils: 62 %
Platelets: 350 10*3/uL (ref 150–450)
RBC: 5.34 x10E6/uL — ABNORMAL HIGH (ref 3.77–5.28)
RDW: 13.6 % (ref 11.7–15.4)
WBC: 11.6 10*3/uL — ABNORMAL HIGH (ref 3.4–10.8)

## 2020-08-07 LAB — COMPREHENSIVE METABOLIC PANEL
ALT: 28 IU/L (ref 0–32)
AST: 21 IU/L (ref 0–40)
Albumin/Globulin Ratio: 2.1 (ref 1.2–2.2)
Albumin: 4.7 g/dL (ref 3.8–4.8)
Alkaline Phosphatase: 215 IU/L — ABNORMAL HIGH (ref 44–121)
BUN/Creatinine Ratio: 17 (ref 12–28)
BUN: 11 mg/dL (ref 8–27)
Bilirubin Total: 0.4 mg/dL (ref 0.0–1.2)
CO2: 23 mmol/L (ref 20–29)
Calcium: 10.1 mg/dL (ref 8.7–10.3)
Chloride: 97 mmol/L (ref 96–106)
Creatinine, Ser: 0.66 mg/dL (ref 0.57–1.00)
GFR calc Af Amer: 106 mL/min/{1.73_m2} (ref >59)
GFR calc non Af Amer: 92 mL/min/{1.73_m2} (ref >59)
Globulin, Total: 2.2 g/dL (ref 1.5–4.5)
Glucose: 116 mg/dL — ABNORMAL HIGH (ref 65–99)
Potassium: 5.4 mmol/L — ABNORMAL HIGH (ref 3.5–5.2)
Sodium: 136 mmol/L (ref 134–144)
Total Protein: 6.9 g/dL (ref 6.0–8.5)

## 2020-08-07 LAB — LIPID PANEL
Chol/HDL Ratio: 4.9 ratio — ABNORMAL HIGH (ref 0.0–4.4)
Cholesterol, Total: 175 mg/dL (ref 100–199)
HDL: 36 mg/dL — ABNORMAL LOW (ref >39)
LDL Chol Calc (NIH): 93 mg/dL (ref 0–99)
Triglycerides: 275 mg/dL — ABNORMAL HIGH (ref 0–149)
VLDL Cholesterol Cal: 46 mg/dL — ABNORMAL HIGH (ref 5–40)

## 2020-08-07 LAB — HEMOGLOBIN A1C
Est. average glucose Bld gHb Est-mCnc: 160 mg/dL
Hgb A1c MFr Bld: 7.2 % — ABNORMAL HIGH (ref 4.8–5.6)

## 2020-08-09 ENCOUNTER — Other Ambulatory Visit: Payer: Self-pay | Admitting: Internal Medicine

## 2020-08-09 ENCOUNTER — Telehealth: Payer: Self-pay

## 2020-08-09 DIAGNOSIS — E785 Hyperlipidemia, unspecified: Secondary | ICD-10-CM

## 2020-08-09 DIAGNOSIS — E1169 Type 2 diabetes mellitus with other specified complication: Secondary | ICD-10-CM

## 2020-08-09 MED ORDER — FENOFIBRATE 145 MG PO TABS: 145.0000 mg | ORAL_TABLET | Freq: Every day | ORAL | 1 refills | Status: DC

## 2020-08-09 NOTE — Telephone Encounter (Signed)
Patient called with concerns about Vascepa being sent in. Patient says she is allergic to shellfish and wants to know if its safe to take fish oil/ Vascepa.   Per Dr. Army Melia told her not to pick up the medication and Dr. Army Melia will start her on Tricor instead. 145 mg once daily.  Patient verbalized understanding.

## 2020-08-23 ENCOUNTER — Other Ambulatory Visit: Payer: Self-pay

## 2020-08-24 ENCOUNTER — Ambulatory Visit
Admission: RE | Admit: 2020-08-24 | Discharge: 2020-08-24 | Disposition: A | Discharge status: Home / Self Care | Payer: Medicare Other | Source: Ambulatory Visit | Attending: Internal Medicine | Admitting: Internal Medicine

## 2020-08-24 DIAGNOSIS — E2839 Other primary ovarian failure: Secondary | ICD-10-CM | POA: Insufficient documentation

## 2020-08-24 DIAGNOSIS — Z1231 Encounter for screening mammogram for malignant neoplasm of breast: Secondary | ICD-10-CM | POA: Diagnosis present

## 2020-10-06 ENCOUNTER — Other Ambulatory Visit: Payer: Self-pay | Admitting: Internal Medicine

## 2020-10-06 DIAGNOSIS — H8113 Benign paroxysmal vertigo, bilateral: Secondary | ICD-10-CM

## 2020-10-06 DIAGNOSIS — R0609 Other forms of dyspnea: Secondary | ICD-10-CM

## 2020-10-06 NOTE — Telephone Encounter (Signed)
Future visit in 1 month  

## 2020-10-27 ENCOUNTER — Other Ambulatory Visit: Payer: Self-pay | Admitting: Internal Medicine

## 2020-10-27 DIAGNOSIS — G2581 Restless legs syndrome: Secondary | ICD-10-CM

## 2020-10-27 NOTE — Telephone Encounter (Signed)
Requested medication (s) are due for refill today: no  Requested medication (s) are on the active medication list: yes  Last refill:  10/06/2020  Future visit scheduled: yes  Notes to clinic: this refill cannot be delegated    Requested Prescriptions  Pending Prescriptions Disp Refills   cyclobenzaprine (FLEXERIL) 10 MG tablet [Pharmacy Med Name: CYCLOBENZAPRINE 10 MG TABLET] 90 tablet 1    Sig: TAKE 1 TABLET (10 MG TOTAL) BY MOUTH AT BEDTIME. RESTLESS LEG- USUALLY AT BEDTIME.      Not Delegated - Analgesics:  Muscle Relaxants Failed - 10/27/2020 10:43 AM      Failed - This refill cannot be delegated      Passed - Valid encounter within last 6 months    Recent Outpatient Visits           2 months ago Type II diabetes mellitus with complication [facility])   [facility] Glean Hess, MD   7 months ago Type II diabetes mellitus with complication [facility])   [facility] Glean Hess, MD   9 months ago Restless leg syndrome   [facility] Glean Hess, MD   10 months ago Type II diabetes mellitus with complication [facility])   [facility] Glean Hess, MD   1 year ago Annual physical exam   [facility] Glean Hess, MD       Future Appointments             In 3 weeks Army Melia Jesse Sans, MD [facility] [place]   In 9 months Army Melia Jesse Sans, MD [facility]

## 2020-11-10 ENCOUNTER — Ambulatory Visit (INDEPENDENT_AMBULATORY_CARE_PROVIDER_SITE_OTHER): Payer: Medicare Other

## 2020-11-10 ENCOUNTER — Other Ambulatory Visit: Payer: Self-pay

## 2020-11-10 VITALS — BP 112/78 | HR 71 | Temp 98.2°F | Resp 16 | Ht 67.0 in | Wt 253.2 lb

## 2020-11-10 DIAGNOSIS — Z0000 Encounter for general adult medical examination without abnormal findings: Secondary | ICD-10-CM

## 2020-11-10 NOTE — Patient Instructions (Signed)
Diane Morris , Thank you for taking time to come for your Medicare Wellness Visit. I appreciate your ongoing commitment to your health goals. Please review the following plan we discussed and let me know if I can assist you in the future.   Screening recommendations/referrals: Colonoscopy: done 10/09/16. Repeat in 2023 Mammogram: done 08/24/20 Bone Density: 08/24/20 Recommended yearly ophthalmology/optometry visit for glaucoma screening and checkup Recommended yearly dental visit for hygiene and checkup  Vaccinations: Influenza vaccine: done 03/10/20 Pneumococcal vaccine: done 08/06/20 Tdap vaccine: due Shingles vaccine: Shingrix discussed. Please contact your pharmacy for coverage information.  Covid-19: done 09/24/19, 10/22/19 & 04/26/20  Advanced directives: Please bring a copy of your health care power of attorney and living will to the office at your convenience once you have completed that documentation.   Conditions/risks identified: Recommend increasing physical activity to at least 3 days per week   Next appointment: Follow up in one year for your annual wellness visit    Preventive Care 65 Years and Older, Female Preventive care refers to lifestyle choices and visits with your health care provider that can promote health and wellness. What does preventive care include?  A yearly physical exam. This is also called an annual well check.  Dental exams once or twice a year.  Routine eye exams. Ask your health care provider how often you should have your eyes checked.  Personal lifestyle choices, including:  Daily care of your teeth and gums.  Regular physical activity.  Eating a healthy diet.  Avoiding tobacco and drug use.  Limiting alcohol use.  Practicing safe sex.  Taking low-dose aspirin every day.  Taking vitamin and mineral supplements as recommended by your health care provider. What happens during an annual well check? The services and screenings done by your  health care provider during your annual well check will depend on your age, overall health, lifestyle risk factors, and family history of disease. Counseling  Your health care provider may ask you questions about your:  Alcohol use.  Tobacco use.  Drug use.  Emotional well-being.  Home and relationship well-being.  Sexual activity.  Eating habits.  History of falls.  Memory and ability to understand (cognition).  Work and work Statistician.  Reproductive health. Screening  You may have the following tests or measurements:  Height, weight, and BMI.  Blood pressure.  Lipid and cholesterol levels. These may be checked every 5 years, or more frequently if you are over 17 years old.  Skin check.  Lung cancer screening. You may have this screening every year starting at age 39 if you have a 30-pack-year history of smoking and currently smoke or have quit within the past 15 years.  Fecal occult blood test (FOBT) of the stool. You may have this test every year starting at age 63.  Flexible sigmoidoscopy or colonoscopy. You may have a sigmoidoscopy every 5 years or a colonoscopy every 10 years starting at age 52.  Hepatitis C blood test.  Hepatitis B blood test.  Sexually transmitted disease (STD) testing.  Diabetes screening. This is done by checking your blood sugar (glucose) after you have not eaten for a while (fasting). You may have this done every 1-3 years.  Bone density scan. This is done to screen for osteoporosis. You may have this done starting at age 74.  Mammogram. This may be done every 1-2 years. Talk to your health care provider about how often you should have regular mammograms. Talk with your health care provider about your  test results, treatment options, and if necessary, the need for more tests. Vaccines  Your health care provider may recommend certain vaccines, such as:  Influenza vaccine. This is recommended every year.  Tetanus, diphtheria, and  acellular pertussis (Tdap, Td) vaccine. You may need a Td booster every 10 years.  Zoster vaccine. You may need this after age 79.  Pneumococcal 13-valent conjugate (PCV13) vaccine. One dose is recommended after age 57.  Pneumococcal polysaccharide (PPSV23) vaccine. One dose is recommended after age 52. Talk to your health care provider about which screenings and vaccines you need and how often you need them. This information is not intended to replace advice given to you by your health care provider. Make sure you discuss any questions you have with your health care provider. Document Released: 06/25/2015 Document Revised: 02/16/2016 Document Reviewed: 03/30/2015 Elsevier Interactive Patient Education  2017 [place] Prevention in the Home Falls can cause injuries. They can happen to people of all ages. There are many things you can do to make your home safe and to help prevent falls. What can I do on the outside of my home?  Regularly fix the edges of walkways and driveways and fix any cracks.  Remove anything that might make you trip as you walk through a door, such as a raised step or threshold.  Trim any bushes or trees on the path to your home.  Use bright outdoor lighting.  Clear any walking paths of anything that might make someone trip, such as rocks or tools.  Regularly check to see if handrails are loose or broken. Make sure that both sides of any steps have handrails.  Any raised decks and porches should have guardrails on the edges.  Have any leaves, snow, or ice cleared regularly.  Use sand or salt on walking paths during winter.  Clean up any spills in your garage right away. This includes oil or grease spills. What can I do in the bathroom?  Use night lights.  Install grab bars by the toilet and in the tub and shower. Do not use towel bars as grab bars.  Use non-skid mats or decals in the tub or shower.  If you need to sit down in the shower, use  a plastic, non-slip stool.  Keep the floor dry. Clean up any water that spills on the floor as soon as it happens.  Remove soap buildup in the tub or shower regularly.  Attach bath mats securely with double-sided non-slip rug tape.  Do not have throw rugs and other things on the floor that can make you trip. What can I do in the bedroom?  Use night lights.  Make sure that you have a light by your bed that is easy to reach.  Do not use any sheets or blankets that are too big for your bed. They should not hang down onto the floor.  Have a firm chair that has side arms. You can use this for support while you get dressed.  Do not have throw rugs and other things on the floor that can make you trip. What can I do in the kitchen?  Clean up any spills right away.  Avoid walking on wet floors.  Keep items that you use a lot in easy-to-reach places.  If you need to reach something above you, use a strong step stool that has a grab bar.  Keep electrical cords out of the way.  Do not use floor polish or wax that  makes floors slippery. If you must use wax, use non-skid floor wax.  Do not have throw rugs and other things on the floor that can make you trip. What can I do with my stairs?  Do not leave any items on the stairs.  Make sure that there are handrails on both sides of the stairs and use them. Fix handrails that are broken or loose. Make sure that handrails are as long as the stairways.  Check any carpeting to make sure that it is firmly attached to the stairs. Fix any carpet that is loose or worn.  Avoid having throw rugs at the top or bottom of the stairs. If you do have throw rugs, attach them to the floor with carpet tape.  Make sure that you have a light switch at the top of the stairs and the bottom of the stairs. If you do not have them, ask someone to add them for you. What else can I do to help prevent falls?  Wear shoes that:  Do not have high heels.  Have  rubber bottoms.  Are comfortable and fit you well.  Are closed at the toe. Do not wear sandals.  If you use a stepladder:  Make sure that it is fully opened. Do not climb a closed stepladder.  Make sure that both sides of the stepladder are locked into place.  Ask someone to hold it for you, if possible.  Clearly mark and make sure that you can see:  Any grab bars or handrails.  First and last steps.  Where the edge of each step is.  Use tools that help you move around (mobility aids) if they are needed. These include:  Canes.  Walkers.  Scooters.  Crutches.  Turn on the lights when you go into a dark area. Replace any light bulbs as soon as they burn out.  Set up your furniture so you have a clear path. Avoid moving your furniture around.  If any of your floors are uneven, fix them.  If there are any pets around you, be aware of where they are.  Review your medicines with your doctor. Some medicines can make you feel dizzy. This can increase your chance of falling. Ask your doctor what other things that you can do to help prevent falls. This information is not intended to replace advice given to you by your health care provider. Make sure you discuss any questions you have with your health care provider. Document Released: 03/25/2009 Document Revised: 11/04/2015 Document Reviewed: 07/03/2014 Elsevier Interactive Patient Education  2017 [employer].

## 2020-11-10 NOTE — Progress Notes (Signed)
Subjective:   Diane Morris is a 70 y.o. female who presents for Medicare Annual (Subsequent) preventive examination.  Review of Systems     Cardiac Risk Factors include: advanced age (>60men, >33 women);diabetes mellitus;dyslipidemia;hypertension;obesity (BMI >30kg/m2)     Objective:    Today's Vitals   11/10/20 1126 11/10/20 1128  BP: 112/78   Pulse: 71   Resp: 16   Temp: 98.2 F (36.8 C)   TempSrc: Oral   SpO2: 98%   Weight: 253 lb 3.2 oz (114.9 kg)   Height: 5\' 7"  (1.702 m)   PainSc:  2    Body mass index is 39.66 kg/m.  Advanced Directives 11/10/2020 11/05/2019 06/04/2019 01/03/2018 02/14/2017 10/09/2016  Does Patient Have a Medical Advance Directive? No No No No No No  Would patient like information on creating a medical advance directive? No - Patient declined Yes (MAU/Ambulatory/Procedural Areas - Information given) - Yes (MAU/Ambulatory/Procedural Areas - Information given) - No - Patient declined    Current Medications (verified) Outpatient Encounter Medications as of 11/10/2020  Medication Sig  . atorvastatin (LIPITOR) 80 MG tablet Take 80 mg by mouth daily.   . Cetirizine HCl (ALLERGY, CETIRIZINE, PO) Take by mouth.  . cyclobenzaprine (FLEXERIL) 10 MG tablet TAKE 1 TABLET (10 MG TOTAL) BY MOUTH AT BEDTIME. RESTLESS LEG- USUALLY AT BEDTIME.  . dapagliflozin propanediol (FARXIGA) 10 MG TABS tablet Take 1 tablet (10 mg total) by mouth daily.  . fenofibrate (TRICOR) 145 MG tablet Take 1 tablet (145 mg total) by mouth daily.  Marland Kitchen gabapentin (NEURONTIN) 300 MG capsule Take 300 mg by mouth daily. Sciatic Nerve Pain- taking only at bedtime.  . Ibuprofen 200 MG CAPS Take by mouth.  Marland Kitchen lisinopril-hydrochlorothiazide (ZESTORETIC) 10-12.5 MG tablet Take 1 tablet by mouth daily.  . meclizine (ANTIVERT) 25 MG tablet Take 12.5 mg by mouth daily. OTC daily.  . metoprolol succinate (TOPROL-XL) 100 MG 24 hr tablet Take 1 tablet (100 mg total) by mouth every evening. Take with or  immediately following a meal.  . omeprazole (PRILOSEC) 20 MG capsule Take 20 mg by mouth daily. OTC  . OTEZLA 30 MG TABS Take 1 tablet by mouth daily.  Marland Kitchen venlafaxine XR (EFFEXOR-XR) 37.5 MG 24 hr capsule TAKE 1 CAPSULE BY MOUTH DAILY WITH BREAKFAST.  . VENTOLIN HFA 108 (90 Base) MCG/ACT inhaler TAKE 2 PUFFS BY MOUTH EVERY 6 HOURS AS NEEDED FOR WHEEZE OR SHORTNESS OF BREATH   No facility-administered encounter medications on file as of 11/10/2020.    Allergies (verified) Bee venom, Contrast media [iodinated diagnostic agents], Codeine, Shellfish allergy, and Sodium clavulanate   History: Past Medical History:  Diagnosis Date  . BPPV (benign paroxysmal positional vertigo)   . Depression   . Depression 11/09/2015  . Diabetes mellitus type 2, noninsulin dependent ([place])   . Dysrhythmia    tachycardia - controlled with metoprolol  . GERD (gastroesophageal reflux disease)   . Hepatitis A    as teenager. treated  . Hepatitis B 12/2015   positive test, patient states she does not have hepatitis B, last panel was negative which was the 3rd panel  . Hx of blood clots 2016   left calf  . Hypercholesteremia   . Hypertension   . Long term current use of opiate analgesic 02/14/2017  . Long term prescription opiate use 02/14/2017  . Opiate use 02/14/2017  . Psoriatic arthritis ([place])   . Sciatica of left side   . Vertigo    when anxious   Past  Surgical History:  Procedure Laterality Date  . COLONOSCOPY    . COLONOSCOPY WITH PROPOFOL N/A 10/09/2016   Procedure: COLONOSCOPY WITH PROPOFOL;  Surgeon: [name], MD;  Location: [facility];  Service: Endoscopy;  Laterality: N/A;  Diabetic - oral meds  . DILATION AND CURETTAGE OF UTERUS    . ESOPHAGOGASTRODUODENOSCOPY (EGD) WITH PROPOFOL N/A 01/03/2018   Procedure: ESOPHAGOGASTRODUODENOSCOPY (EGD) WITH PROPOFOL;  Surgeon: [name], MD;  Location: [facility];  Service: Endoscopy;  Laterality: N/A;  Diabetic - oral meds  . INDUCED  ABORTION    . POLYPECTOMY  10/09/2016   Procedure: POLYPECTOMY;  Surgeon: [name], MD;  Location: [facility];  Service: Endoscopy;;  . TUBAL LIGATION     Family History  Problem Relation Age of Onset  . Congestive Heart Failure Mother   . Emphysema Mother   . Heart disease Father   . Hypertension Sister   . Cancer Brother   . Hypertension Brother   . Breast cancer Neg Hx    Social History   Socioeconomic History  . Marital status: Widowed    Spouse name: Not on file  . Number of children: 2  . Years of education: Not on file  . Highest education level: Not on file  Occupational History  . Not on file  Tobacco Use  . Smoking status: Former Smoker    Packs/day: 1.00    Years: 18.00    Pack years: 18.00    Types: Cigarettes    Quit date: 06/12/2006    Years since quitting: 14.4  . Smokeless tobacco: Never Used  Vaping Use  . Vaping Use: Never used  Substance and Sexual Activity  . Alcohol use: No  . Drug use: No  . Sexual activity: Not on file  Other Topics Concern  . Not on file  Social History Narrative   Pt lives alone. 2 children live in [place].    Social Determinants of Health   Financial Resource Strain: Low Risk   . Difficulty of Paying Living Expenses: Not hard at all  Food Insecurity: No Food Insecurity  . Worried About Charity fundraiser in the Last Year: Never true  . Ran Out of Food in the Last Year: Never true  Transportation Needs: No Transportation Needs  . Lack of Transportation (Medical): No  . Lack of Transportation (Non-Medical): No  Physical Activity: Inactive  . Days of Exercise per Week: 0 days  . Minutes of Exercise per Session: 0 min  Stress: No Stress Concern Present  . Feeling of Stress : Not at all  Social Connections: Socially Isolated  . Frequency of Communication with Friends and Family: More than three times a week  . Frequency of Social Gatherings with Friends and Family: Twice a week  . Attends Religious  Services: Never  . Active Member of Clubs or Organizations: No  . Attends Archivist Meetings: Never  . Marital Status: Widowed    Tobacco Counseling Counseling given: Not Answered   Clinical Intake:  Pre-visit preparation completed: Yes  Pain : 0-10 Pain Score: 2  Pain Type: Chronic pain Pain Location: Knee Pain Orientation: Right,Left Pain Descriptors / Indicators: Aching,Discomfort,Sore Pain Onset: More than a month ago Pain Frequency: Constant     BMI - recorded: 39.66 Nutritional Status: BMI > 30  Obese Nutritional Risks: Nausea/ vomitting/ diarrhea (stomach virus May 2022) Diabetes: Yes CBG done?: No Did pt. bring in CBG monitor from home?: No  How often do you  need to have someone help you when you read instructions, pamphlets, or other written materials from your doctor or pharmacy?: 1 - Never  Nutrition Risk Assessment:  Has the patient had any N/V/D within the last 2 months?  Yes  Does the patient have any non-healing wounds?  No  Has the patient had any unintentional weight loss or weight gain?  No   Diabetes:  Is the patient diabetic?  Yes  If diabetic, was a CBG obtained today?  No  Did the patient bring in their glucometer from home?  No  How often do you monitor your CBG's? Pt does not actively check blood sugar.   Financial Strains and Diabetes Management:  Are you having any financial strains with the device, your supplies or your medication? No .  Does the patient want to be seen by Chronic Care Management for management of their diabetes?  No  Would the patient like to be referred to a Nutritionist or for Diabetic Management?  No   Diabetic Exams:  Diabetic Eye Exam: Completed 02/20/18 negative retinopathy. Overdue for diabetic eye exam. Pt has been advised about the importance in completing this exam.   Diabetic Foot Exam: Completed 03/10/20.   Interpreter Needed?: No  Information entered by :: Clemetine Marker LPN   Activities of  Daily Living In your present state of health, do you have any difficulty performing the following activities: 11/10/2020  Hearing? N  Comment declines hearing aids  Vision? N  Difficulty concentrating or making decisions? N  Walking or climbing stairs? N  Dressing or bathing? N  Doing errands, shopping? N  Preparing Food and eating ? N  Using the Toilet? N  In the past six months, have you accidently leaked urine? N  Do you have problems with loss of bowel control? N  Managing your Medications? N  Managing your Finances? N  Housekeeping or managing your Housekeeping? N  Some recent data might be hidden    Patient Care Team: Glean Hess, MD as PCP - General (Internal Medicine) [name], MD as Consulting Physician (Gastroenterology) Eulogio Bear, MD as Consulting Physician (Ophthalmology) Quintin Alto, MD as Consulting Physician (Rheumatology)  Indicate any recent Medical Services you may have received from other than [facility] providers in the past year (date may be approximate).     Assessment:   This is a routine wellness examination for Diane Morris.  Hearing/Vision screen  Hearing Screening   125Hz  250Hz  500Hz  1000Hz  2000Hz  3000Hz  4000Hz  6000Hz  8000Hz   Right ear:           Left ear:           Comments: Pt denies hearing difficulty  Vision Screening Comments: Annual vision screenings done at [facility]; due for exam   Dietary issues and exercise activities discussed: Current Exercise Habits: The patient does not participate in regular exercise at present, Exercise limited by: orthopedic condition(s)  Goals Addressed   None    Depression Screen PHQ 2/9 Scores 11/10/2020 08/06/2020 03/10/2020 01/23/2020 12/08/2019 11/05/2019 08/06/2019  PHQ - 2 Score 0 4 2 0 4 0 0  PHQ- 9 Score - 6 4 3 10  - 0    Fall Risk Fall Risk  11/10/2020 08/06/2020 12/08/2019 11/05/2019 01/31/2019  Falls in the past year? 0 0 0 0 0  Number falls in past yr: 0 - 0 0 0  Injury with Fall?  0 - 0 0 0  Risk for fall due to : No Fall Risks -  No Fall Risks Other (Comment) -  Risk for fall due to: Comment - - - frequent vertigo -  Follow up Falls prevention discussed Falls evaluation completed Falls evaluation completed Falls prevention discussed Falls evaluation completed    [place]:  Any stairs in or around the home? No  If so, are there any without handrails? No  Home free of loose throw rugs in walkways, pet beds, electrical cords, etc? Yes  Adequate lighting in your home to reduce risk of falls? Yes   ASSISTIVE DEVICES UTILIZED TO PREVENT FALLS:  Life alert? No  Use of a cane, walker or w/c? No  Grab bars in the bathroom? No  Shower chair or bench in shower? No  Elevated toilet seat or a handicapped toilet? No   TIMED UP AND GO:  Was the test performed? Yes .  Length of time to ambulate 10 feet: 5 sec.   Gait steady and fast without use of assistive device  Cognitive Function:     6CIT Screen 11/05/2019  What Year? 0 points  What month? 0 points  What time? 0 points  Count back from 20 0 points  Months in reverse 0 points  Repeat phrase 0 points  Total Score 0    Immunizations Immunization History  Administered Date(s) Administered  . Fluad Quad(high Dose 65+) 03/10/2020  . Moderna Sars-Covid-2 Vaccination 09/24/2019, 10/22/2019, 04/26/2020  . Pneumococcal Conjugate-13 08/06/2020    TDAP status: Due, Education has been provided regarding the importance of this vaccine. Advised may receive this vaccine at local pharmacy or Health Dept. Aware to provide a copy of the vaccination record if obtained from local pharmacy or Health Dept. Verbalized acceptance and understanding.  Flu Vaccine status: Up to date  Pneumococcal vaccine status: Up to date  Covid-19 vaccine status: Completed vaccines  Qualifies for Shingles Vaccine? Yes   Zostavax completed No   Shingrix Completed?: No.    Education has been provided  regarding the importance of this vaccine. Patient has been advised to call insurance company to determine out of pocket expense if they have not yet received this vaccine. Advised may also receive vaccine at local pharmacy or Health Dept. Verbalized acceptance and understanding.  Screening Tests Health Maintenance  Topic Date Due  . Zoster Vaccines- Shingrix (1 of 2) Never done  . OPHTHALMOLOGY EXAM  02/21/2019  . TETANUS/TDAP  08/06/2021 (Originally 01/27/1970)  . INFLUENZA VACCINE  01/10/2021  . HEMOGLOBIN A1C  02/03/2021  . FOOT EXAM  03/10/2021  . PNA vac Low Risk Adult (2 of 2 - PPSV23) 08/06/2021  . MAMMOGRAM  08/24/2021  . COLONOSCOPY (Pts 45-18yrs Insurance coverage will need to be confirmed)  10/09/2021  . DEXA SCAN  Completed  . COVID-19 Vaccine  Completed  . Hepatitis C Screening  Completed  . HPV VACCINES  Aged Out    Health Maintenance  Health Maintenance Due  Topic Date Due  . Zoster Vaccines- Shingrix (1 of 2) Never done  . OPHTHALMOLOGY EXAM  02/21/2019    Colorectal cancer screening: Type of screening: Colonoscopy. Completed 10/09/16. Repeat every 5 years  Mammogram status: Completed 08/24/20. Repeat every year  Bone Density status: Completed 08/24/20. Results reflect: Bone density results: OSTEOPENIA. Repeat every 2 years.  Lung Cancer Screening: (Low Dose CT Chest recommended if Age 79-80 years, 30 pack-year currently smoking OR have quit w/in 15years.) does not qualify.   Additional Screening:  Hepatitis C Screening: does qualify; Completed 07/04/19  Vision Screening:  Recommended annual ophthalmology exams for early detection of glaucoma and other disorders of the eye. Is the patient up to date with their annual eye exam?  No  Who is the provider or what is the name of the office in which the patient attends annual eye exams? [facility] Screening: Recommended annual dental exams for proper oral hygiene  Community Resource Referral /  Chronic Care Management: CRR required this visit?  No   CCM required this visit?  No      Plan:     I have personally reviewed and noted the following in the patient's chart:   . Medical and social history . Use of alcohol, tobacco or illicit drugs  . Current medications and supplements including opioid prescriptions.  . Functional ability and status . Nutritional status . Physical activity . Advanced directives . List of other physicians . Hospitalizations, surgeries, and ER visits in previous 12 months . Vitals . Screenings to include cognitive, depression, and falls . Referrals and appointments  In addition, I have reviewed and discussed with patient certain preventive protocols, quality metrics, and best practice recommendations. A written personalized care plan for preventive services as well as general preventive health recommendations were provided to patient.     Clemetine Marker, LPN   0/07/1115   Nurse Notes: pt c/o having a stomach virus on 10/29/20 with fever n/v/d and feels like she still has low energy, decreased appetite and nausea. Pt scheduled for follow up 11/19/20 with Dr. Army Melia.

## 2020-11-19 ENCOUNTER — Encounter: Payer: Self-pay | Admitting: Internal Medicine

## 2020-11-19 ENCOUNTER — Ambulatory Visit (INDEPENDENT_AMBULATORY_CARE_PROVIDER_SITE_OTHER): Payer: Medicare Other | Admitting: Internal Medicine

## 2020-11-19 ENCOUNTER — Other Ambulatory Visit: Payer: Self-pay

## 2020-11-19 VITALS — BP 112/78 | HR 78 | Temp 98.3°F | Ht 67.0 in | Wt 254.0 lb

## 2020-11-19 DIAGNOSIS — I10 Essential (primary) hypertension: Secondary | ICD-10-CM | POA: Diagnosis not present

## 2020-11-19 DIAGNOSIS — E118 Type 2 diabetes mellitus with unspecified complications: Secondary | ICD-10-CM

## 2020-11-19 DIAGNOSIS — E785 Hyperlipidemia, unspecified: Secondary | ICD-10-CM

## 2020-11-19 DIAGNOSIS — E1169 Type 2 diabetes mellitus with other specified complication: Secondary | ICD-10-CM

## 2020-11-19 NOTE — Patient Instructions (Signed)
Call [facility] for your diabetic eye exam

## 2020-11-19 NOTE — Progress Notes (Signed)
Date:  11/19/2020   Name:  Diane Morris   DOB:  Dec 07, 1950   MRN:  [id number]   Chief Complaint: Hypertension and Diabetes  Diabetes She presents for her follow-up diabetic visit. She has type 2 diabetes mellitus. Her disease course has been stable. Pertinent negatives for hypoglycemia include no headaches or tremors. Pertinent negatives for diabetes include no chest pain, no fatigue, no polydipsia and no polyuria. Current diabetic treatments: farxiga. She is compliant with treatment all of the time. An ACE inhibitor/angiotensin II receptor blocker is being taken.  Hyperlipidemia This is a chronic problem. The problem is controlled. Exacerbating diseases include diabetes. Pertinent negatives include no chest pain or shortness of breath. Current antihyperlipidemic treatment includes statins and fibric acid derivatives (tricor added last visit).  Hypertension This is a chronic problem. The problem is controlled. Pertinent negatives include no chest pain, headaches, palpitations or shortness of breath. Past treatments include ACE inhibitors, diuretics and beta blockers.   Lab Results  Component Value Date   CREATININE 0.66 08/06/2020   BUN 11 08/06/2020   NA 136 08/06/2020   K 5.4 (H) 08/06/2020   CL 97 08/06/2020   CO2 23 08/06/2020   Lab Results  Component Value Date   CHOL 175 08/06/2020   HDL 36 (L) 08/06/2020   LDLCALC 93 08/06/2020   TRIG 275 (H) 08/06/2020   CHOLHDL 4.9 (H) 08/06/2020   Lab Results  Component Value Date   TSH 2.110 08/06/2019   Lab Results  Component Value Date   HGBA1C 7.2 (H) 08/06/2020   Lab Results  Component Value Date   WBC 11.6 (H) 08/06/2020   HGB 15.1 08/06/2020   HCT 45.3 08/06/2020   MCV 85 08/06/2020   PLT 350 08/06/2020   Lab Results  Component Value Date   ALT 28 08/06/2020   AST 21 08/06/2020   ALKPHOS 215 (H) 08/06/2020   BILITOT 0.4 08/06/2020     Review of Systems  Constitutional:  Negative for appetite change,  fatigue, fever and unexpected weight change.  HENT:  Negative for tinnitus and trouble swallowing.   Eyes:  Negative for visual disturbance.  Respiratory:  Negative for cough, chest tightness and shortness of breath.   Cardiovascular:  Negative for chest pain, palpitations and leg swelling.  Gastrointestinal:  Negative for abdominal pain.  Endocrine: Negative for polydipsia and polyuria.  Genitourinary:  Negative for dysuria and hematuria.  Musculoskeletal:  Negative for arthralgias.       Varicose veins without pain or swelling  Neurological:  Negative for tremors, numbness and headaches.  Psychiatric/Behavioral:  Negative for dysphoric mood.    Patient Active Problem List   Diagnosis Date Noted   Mood disorder ([place]) 12/08/2019   Hepatic steatosis 12/07/2019   Elevated alkaline phosphatase level 08/08/2019   Hyperlipidemia associated with type 2 diabetes mellitus ([place]) 08/06/2019   Essential hypertension 01/31/2019   GERD without esophagitis 01/31/2019   Type II diabetes mellitus with complication ([place]) 00/93/8182   Morbid obesity ([place]) 01/31/2019   Benign paroxysmal positional vertigo due to bilateral vestibular disorder 01/31/2019   History of gastritis    Vitamin D deficiency 02/19/2017   Chronic low back pain (Primary Area of Pain) (L>R) 02/14/2017   Chronic hip pain (Secondary Area of Pain) (L>R) 02/14/2017   Chronic right shoulder pain (Tertiary Area of Pain) 02/14/2017   Bilateral chronic knee pain (Fourth Area of Pain) (L>R) 02/14/2017   Chronic neck pain 02/14/2017   Chronic upper extremity pain 02/14/2017  Chronic sacroiliac joint pain 02/14/2017   Adenomatous polyp of colon    CTS (carpal tunnel syndrome) 08/28/2016   Hepatitis B surface antigen positive 11/19/2015   Enthesitis 11/19/2015   Facet joint disease of lumbosacral region ([place]) 11/19/2015   Chronic bilateral low back pain with left-sided sciatica 11/09/2015   Psoriasis 11/09/2015   Psoriatic arthritis  ([place]) 11/09/2015   Tendinitis of right foot 11/09/2015    Allergies  Allergen Reactions   Bee Venom Swelling   Contrast Media [Iodinated Diagnostic Agents] Shortness Of Breath   Codeine Nausea And Vomiting        Shellfish Allergy Nausea And Vomiting, Swelling and Rash        Sodium Clavulanate Diarrhea    Past Surgical History:  Procedure Laterality Date   COLONOSCOPY     COLONOSCOPY WITH PROPOFOL N/A 10/09/2016   Procedure: COLONOSCOPY WITH PROPOFOL;  Surgeon: [name], MD;  Location: [facility];  Service: Endoscopy;  Laterality: N/A;  Diabetic - oral meds   DILATION AND CURETTAGE OF UTERUS     ESOPHAGOGASTRODUODENOSCOPY (EGD) WITH PROPOFOL N/A 01/03/2018   Procedure: ESOPHAGOGASTRODUODENOSCOPY (EGD) WITH PROPOFOL;  Surgeon: [name], MD;  Location: [facility];  Service: Endoscopy;  Laterality: N/A;  Diabetic - oral meds   INDUCED ABORTION     POLYPECTOMY  10/09/2016   Procedure: POLYPECTOMY;  Surgeon: [name], MD;  Location: [facility];  Service: Endoscopy;;   TUBAL LIGATION      Social History   Tobacco Use   Smoking status: Former    Packs/day: 1.00    Years: 18.00    Pack years: 18.00    Types: Cigarettes    Quit date: 06/12/2006    Years since quitting: 14.4   Smokeless tobacco: Never  Vaping Use   Vaping Use: Never used  Substance Use Topics   Alcohol use: No   Drug use: No     Medication list has been reviewed and updated.  Current Meds  Medication Sig   atorvastatin (LIPITOR) 80 MG tablet Take 80 mg by mouth daily.    Cetirizine HCl (ALLERGY, CETIRIZINE, PO) Take by mouth.   cyclobenzaprine (FLEXERIL) 10 MG tablet TAKE 1 TABLET (10 MG TOTAL) BY MOUTH AT BEDTIME. RESTLESS LEG- USUALLY AT BEDTIME.   dapagliflozin propanediol (FARXIGA) 10 MG TABS tablet Take 1 tablet (10 mg total) by mouth daily.   fenofibrate (TRICOR) 145 MG tablet Take 1 tablet (145 mg total) by mouth daily.   gabapentin (NEURONTIN) 300 MG capsule Take  300 mg by mouth daily. Sciatic Nerve Pain- taking only at bedtime.   Ibuprofen 200 MG CAPS Take by mouth.   lisinopril-hydrochlorothiazide (ZESTORETIC) 10-12.5 MG tablet Take 1 tablet by mouth daily.   meclizine (ANTIVERT) 25 MG tablet Take 12.5 mg by mouth daily. OTC daily.   metoprolol succinate (TOPROL-XL) 100 MG 24 hr tablet Take 1 tablet (100 mg total) by mouth every evening. Take with or immediately following a meal.   omeprazole (PRILOSEC) 20 MG capsule Take 20 mg by mouth daily. OTC   OTEZLA 30 MG TABS Take 1 tablet by mouth daily.   venlafaxine XR (EFFEXOR-XR) 37.5 MG 24 hr capsule TAKE 1 CAPSULE BY MOUTH DAILY WITH BREAKFAST.   VENTOLIN HFA 108 (90 Base) MCG/ACT inhaler TAKE 2 PUFFS BY MOUTH EVERY 6 HOURS AS NEEDED FOR WHEEZE OR SHORTNESS OF BREATH    PHQ 2/9 Scores 11/19/2020 11/10/2020 08/06/2020 03/10/2020  PHQ - 2 Score 0 0 4 2  PHQ- 9  Score 0 - 6 4    GAD 7 : Generalized Anxiety Score 11/19/2020 08/06/2020 03/10/2020 01/23/2020  Nervous, Anxious, on Edge 0 0 0 0  Control/stop worrying 0 0 0 0  Worry too much - different things 0 0 0 0  Trouble relaxing 0 0 0 2  Restless 0 0 0 0  Easily annoyed or irritable 0 0 0 1  Afraid - awful might happen 0 0 0 0  Total GAD 7 Score 0 0 0 3  Anxiety Difficulty - - Not difficult at all Not difficult at all    BP Readings from Last 3 Encounters:  11/19/20 112/78  11/10/20 112/78  08/06/20 112/72    Physical Exam Vitals and nursing note reviewed.  Constitutional:      General: She is not in acute distress.    Appearance: She is well-developed.  HENT:     Head: Normocephalic and atraumatic.  Cardiovascular:     Rate and Rhythm: Normal rate and regular rhythm.     Pulses: Normal pulses.     Heart sounds: No murmur heard. Pulmonary:     Effort: Pulmonary effort is normal. No respiratory distress.     Breath sounds: No wheezing or rhonchi.  Musculoskeletal:        General: No swelling.     Cervical back: Normal range of motion.      Right lower leg: No edema.     Left lower leg: No edema.  Lymphadenopathy:     Cervical: No cervical adenopathy.  Skin:    General: Skin is warm and dry.     Findings: No rash.  Neurological:     Mental Status: She is alert and oriented to person, place, and time.  Psychiatric:        Mood and Affect: Mood normal.        Behavior: Behavior normal.    Wt Readings from Last 3 Encounters:  11/19/20 254 lb (115.2 kg)  11/10/20 253 lb 3.2 oz (114.9 kg)  08/06/20 257 lb (116.6 kg)    BP 112/78   Pulse 78   Temp 98.3 F (36.8 C) (Oral)   Ht 5\' 7"  (1.702 m)   Wt 254 lb (115.2 kg)   SpO2 95%   BMI 39.78 kg/m   Assessment and Plan: 1. Type II diabetes mellitus with complication (HCC) Clinically stable by exam and report without s/s of hypoglycemia. She does not check her BS. She is trying to cut back on calories to lose some weight, drinking smoothies, etc DM complicated by HTN and dyslipidemia. Tolerating medications - Farxiga -  well without side effects or other concerns. - Hemoglobin A1c  2. Hyperlipidemia associated with type 2 diabetes mellitus ([place]) Now on atorvastatin; tricor added without side effects - Comprehensive metabolic panel - Lipid panel  3. Essential hypertension Clinically stable exam with well controlled BP. Tolerating medications without side effects at this time. Pt to continue current regimen and low sodium diet; benefits of regular exercise as able discussed.   Partially dictated using Editor, commissioning. Any errors are unintentional.  Halina Maidens, MD [facility]  11/19/2020

## 2020-11-20 LAB — COMPREHENSIVE METABOLIC PANEL
ALT: 24 IU/L (ref 0–32)
AST: 22 IU/L (ref 0–40)
Albumin/Globulin Ratio: 1.8 (ref 1.2–2.2)
Albumin: 4.7 g/dL (ref 3.8–4.8)
Alkaline Phosphatase: 153 IU/L — ABNORMAL HIGH (ref 44–121)
BUN/Creatinine Ratio: 19 (ref 12–28)
BUN: 16 mg/dL (ref 8–27)
Bilirubin Total: 0.4 mg/dL (ref 0.0–1.2)
CO2: 21 mmol/L (ref 20–29)
Calcium: 10.5 mg/dL — ABNORMAL HIGH (ref 8.7–10.3)
Chloride: 95 mmol/L — ABNORMAL LOW (ref 96–106)
Creatinine, Ser: 0.84 mg/dL (ref 0.57–1.00)
Globulin, Total: 2.6 g/dL (ref 1.5–4.5)
Glucose: 110 mg/dL — ABNORMAL HIGH (ref 65–99)
Potassium: 5 mmol/L (ref 3.5–5.2)
Sodium: 135 mmol/L (ref 134–144)
Total Protein: 7.3 g/dL (ref 6.0–8.5)
eGFR: 76 mL/min/{1.73_m2} (ref >59)

## 2020-11-20 LAB — LIPID PANEL
Chol/HDL Ratio: 5.6 ratio — ABNORMAL HIGH (ref 0.0–4.4)
Cholesterol, Total: 197 mg/dL (ref 100–199)
HDL: 35 mg/dL — ABNORMAL LOW (ref >39)
LDL Chol Calc (NIH): 111 mg/dL — ABNORMAL HIGH (ref 0–99)
Triglycerides: 294 mg/dL — ABNORMAL HIGH (ref 0–149)
VLDL Cholesterol Cal: 51 mg/dL — ABNORMAL HIGH (ref 5–40)

## 2020-11-20 LAB — HEMOGLOBIN A1C
Est. average glucose Bld gHb Est-mCnc: 151 mg/dL
Hgb A1c MFr Bld: 6.9 % — ABNORMAL HIGH (ref 4.8–5.6)

## 2021-02-02 ENCOUNTER — Other Ambulatory Visit: Payer: Self-pay | Admitting: Internal Medicine

## 2021-02-02 DIAGNOSIS — G2581 Restless legs syndrome: Secondary | ICD-10-CM

## 2021-02-02 NOTE — Telephone Encounter (Signed)
Requested medication (s) are due for refill today:  yes   Requested medication (s) are on the active medication list:  yes   Last refill:  01/06/2021  Future visit scheduled: yes   Notes to clinic:  this refill cannot be delegated    Requested Prescriptions  Pending Prescriptions Disp Refills   cyclobenzaprine (FLEXERIL) 10 MG tablet [Pharmacy Med Name: CYCLOBENZAPRINE 10 MG TABLET] 30 tablet 0    Sig: TAKE 1 TABLET (10 MG TOTAL) BY MOUTH AT BEDTIME. RESTLESS LEG- USUALLY AT BEDTIME.     Not Delegated - Analgesics:  Muscle Relaxants Failed - 02/02/2021  1:13 PM      Failed - This refill cannot be delegated      Passed - Valid encounter within last 6 months    Recent Outpatient Visits           2 months ago Type II diabetes mellitus with complication [facility])   [facility] Glean Hess, MD   6 months ago Type II diabetes mellitus with complication [facility])   [facility] Glean [facility], MD   10 months ago Type II diabetes mellitus with complication [facility])   [facility] Glean Hess, MD   1 year ago Restless leg syndrome   [facility] Glean Hess, MD   1 year ago Type II diabetes mellitus with complication [facility])   [facility] Glean Hess, MD       Future Appointments             In 1 month Army Melia Jesse Sans, MD [facility], [place]   In 6 months Army Melia Jesse Sans, MD [facility], [facility]

## 2021-02-04 ENCOUNTER — Other Ambulatory Visit: Payer: Self-pay | Admitting: Internal Medicine

## 2021-02-04 DIAGNOSIS — E785 Hyperlipidemia, unspecified: Secondary | ICD-10-CM

## 2021-02-27 ENCOUNTER — Other Ambulatory Visit: Payer: Self-pay | Admitting: Internal Medicine

## 2021-02-27 DIAGNOSIS — H8113 Benign paroxysmal vertigo, bilateral: Secondary | ICD-10-CM

## 2021-02-28 NOTE — Telephone Encounter (Signed)
Requested medication (s) are due for refill today:   Yes  Requested medication (s) are on the active medication list:   Yes  Future visit scheduled:   Yes in 3 wks   Last ordered: 10/06/2020 #90, 0 refills  Returned because failed protocol due to labs being due.   Requested Prescriptions  Pending Prescriptions Disp Refills   venlafaxine XR (EFFEXOR-XR) 37.5 MG 24 hr capsule [Pharmacy Med Name: VENLAFAXINE HCL ER 37.5 MG CAP] 90 capsule 0    Sig: TAKE 1 CAPSULE BY MOUTH DAILY WITH BREAKFAST.     Psychiatry: Antidepressants - SNRI - desvenlafaxine & venlafaxine Failed - 02/27/2021  2:23 PM      Failed - LDL in normal range and within 360 days    LDL Chol Calc (NIH)  Date Value Ref Range Status  11/19/2020 111 (H) 0 - 99 mg/dL Final          Failed - Triglycerides in normal range and within 360 days    Triglycerides  Date Value Ref Range Status  11/19/2020 294 (H) 0 - 149 mg/dL Final          Passed - Total Cholesterol in normal range and within 360 days    Cholesterol, Total  Date Value Ref Range Status  11/19/2020 197 100 - 199 mg/dL Final          Passed - Last BP in normal range    BP Readings from Last 1 Encounters:  11/19/20 112/78          Passed - Valid encounter within last 6 months    Recent Outpatient Visits           3 months ago Type II diabetes mellitus with complication [facility])   [facility] Glean [facility], MD   6 months ago Type II diabetes mellitus with complication [facility])   [facility] Glean [facility], MD   11 months ago Type II diabetes mellitus with complication [facility])   [facility] Glean Hess, MD   1 year ago Restless leg syndrome   [facility] Glean Hess, MD   1 year ago Type II diabetes mellitus with complication [facility])   [facility] Glean Hess, MD       Future Appointments             In 3 weeks Army Melia Jesse Sans, MD [facility], [place]   In 5 months  Army Melia, Jesse Sans, MD [facility], [facility]

## 2021-03-08 ENCOUNTER — Other Ambulatory Visit: Payer: Self-pay | Admitting: Internal Medicine

## 2021-03-08 DIAGNOSIS — R0609 Other forms of dyspnea: Secondary | ICD-10-CM

## 2021-03-08 DIAGNOSIS — E118 Type 2 diabetes mellitus with unspecified complications: Secondary | ICD-10-CM

## 2021-03-08 DIAGNOSIS — I10 Essential (primary) hypertension: Secondary | ICD-10-CM

## 2021-03-08 DIAGNOSIS — G2581 Restless legs syndrome: Secondary | ICD-10-CM

## 2021-03-08 NOTE — Telephone Encounter (Signed)
Requested medications are due for refill today.  yes  Requested medications are on the active medications list.  yes  Last refill. 02/02/2021  Future visit scheduled.   yes  Notes to clinic.  Medication not delegated.

## 2021-03-24 ENCOUNTER — Other Ambulatory Visit: Payer: Self-pay

## 2021-03-24 ENCOUNTER — Encounter: Payer: Self-pay | Admitting: Internal Medicine

## 2021-03-24 ENCOUNTER — Ambulatory Visit (INDEPENDENT_AMBULATORY_CARE_PROVIDER_SITE_OTHER): Payer: Medicare Other | Admitting: Internal Medicine

## 2021-03-24 VITALS — BP 104/76 | HR 78 | Ht 67.0 in | Wt 247.2 lb

## 2021-03-24 DIAGNOSIS — E118 Type 2 diabetes mellitus with unspecified complications: Secondary | ICD-10-CM

## 2021-03-24 DIAGNOSIS — I10 Essential (primary) hypertension: Secondary | ICD-10-CM

## 2021-03-24 DIAGNOSIS — R110 Nausea: Secondary | ICD-10-CM

## 2021-03-24 DIAGNOSIS — L4050 Arthropathic psoriasis, unspecified: Secondary | ICD-10-CM

## 2021-03-24 DIAGNOSIS — Z23 Encounter for immunization: Secondary | ICD-10-CM | POA: Diagnosis not present

## 2021-03-24 LAB — POCT GLYCOSYLATED HEMOGLOBIN (HGB A1C): Hemoglobin A1C: 6.2 % — AB (ref 4.0–5.6)

## 2021-03-24 NOTE — Progress Notes (Signed)
Date:  03/24/2021   Name:  Diane Morris   DOB:  12-Sep-1950   MRN:  [id number]   Chief Complaint: Hypertension and Diabetes  Diabetes She presents for her follow-up diabetic visit. She has type 2 diabetes mellitus. Her disease course has been stable. Pertinent negatives for hypoglycemia include no dizziness, headaches or nervousness/anxiousness. Pertinent negatives for diabetes include no chest pain and no fatigue. Current diabetic treatment includes diet (supposed to be on Farxiga but stopped it 3 months ago.). She is following a generally healthy diet. An ACE inhibitor/angiotensin II receptor blocker is being taken.  Hypertension This is a chronic problem. The problem is controlled. Pertinent negatives include no chest pain, headaches or shortness of breath. Past treatments include ACE inhibitors and diuretics.  Nausea - chronic almost daily for the past three months.  No vomiting.  Some loose stools if she feels well enough to eat solid food.  Mostly drinks protein shakes, soup, Kefir.  Rarely chicken or vegetables.  She has cut out bread to help lose weight.   Lab Results  Component Value Date   CREATININE 0.84 11/19/2020   BUN 16 11/19/2020   NA 135 11/19/2020   K 5.0 11/19/2020   CL 95 (L) 11/19/2020   CO2 21 11/19/2020   Lab Results  Component Value Date   CHOL 197 11/19/2020   HDL 35 (L) 11/19/2020   LDLCALC 111 (H) 11/19/2020   TRIG 294 (H) 11/19/2020   CHOLHDL 5.6 (H) 11/19/2020   Lab Results  Component Value Date   TSH 2.110 08/06/2019   Lab Results  Component Value Date   HGBA1C 6.2 (A) 03/24/2021   Lab Results  Component Value Date   WBC 11.6 (H) 08/06/2020   HGB 15.1 08/06/2020   HCT 45.3 08/06/2020   MCV 85 08/06/2020   PLT 350 08/06/2020   Lab Results  Component Value Date   ALT 24 11/19/2020   AST 22 11/19/2020   ALKPHOS 153 (H) 11/19/2020   BILITOT 0.4 11/19/2020     Review of Systems  Constitutional:  Positive for unexpected weight  change (trying to lose - down 6 lbs.). Negative for chills, fatigue and fever.  HENT:  Negative for trouble swallowing.   Respiratory:  Negative for cough, chest tightness and shortness of breath.   Cardiovascular:  Negative for chest pain.  Gastrointestinal:  Positive for diarrhea and nausea. Negative for abdominal pain, blood in stool and vomiting.  Skin:  Positive for rash (psoriasis).  Neurological:  Negative for dizziness, light-headedness and headaches.  Psychiatric/Behavioral:  Negative for dysphoric mood and sleep disturbance. The patient is not nervous/anxious.    Patient Active Problem List   Diagnosis Date Noted   Mood disorder ([place]) 12/08/2019   Hepatic steatosis 12/07/2019   Elevated alkaline phosphatase level 08/08/2019   Hyperlipidemia associated with type 2 diabetes mellitus ([place]) 08/06/2019   Essential hypertension 01/31/2019   GERD without esophagitis 01/31/2019   Type II diabetes mellitus with complication ([place]) 22/97/9892   Morbid obesity ([place]) 01/31/2019   Benign paroxysmal positional vertigo due to bilateral vestibular disorder 01/31/2019   History of gastritis    Vitamin D deficiency 02/19/2017   Chronic low back pain (Primary Area of Pain) (L>R) 02/14/2017   Chronic hip pain (Secondary Area of Pain) (L>R) 02/14/2017   Chronic right shoulder pain (Tertiary Area of Pain) 02/14/2017   Bilateral chronic knee pain (Fourth Area of Pain) (L>R) 02/14/2017   Chronic neck pain 02/14/2017   Chronic upper extremity  pain 02/14/2017   Chronic sacroiliac joint pain 02/14/2017   Adenomatous polyp of colon    CTS (carpal tunnel syndrome) 08/28/2016   Hepatitis B surface antigen positive 11/19/2015   Enthesitis 11/19/2015   Facet joint disease of lumbosacral region ([place]) 11/19/2015   Chronic bilateral low back pain with left-sided sciatica 11/09/2015   Psoriasis 11/09/2015   Psoriatic arthritis ([place]) 11/09/2015   Tendinitis of right foot 11/09/2015    Allergies   Allergen Reactions   Bee Venom Swelling   Contrast Media [Iodinated Diagnostic Agents] Shortness Of Breath   Codeine Nausea And Vomiting        Shellfish Allergy Nausea And Vomiting, Swelling and Rash        Sodium Clavulanate Diarrhea    Past Surgical History:  Procedure Laterality Date   COLONOSCOPY     COLONOSCOPY WITH PROPOFOL N/A 10/09/2016   Procedure: COLONOSCOPY WITH PROPOFOL;  Surgeon: [name], MD;  Location: [facility];  Service: Endoscopy;  Laterality: N/A;  Diabetic - oral meds   DILATION AND CURETTAGE OF UTERUS     ESOPHAGOGASTRODUODENOSCOPY (EGD) WITH PROPOFOL N/A 01/03/2018   Procedure: ESOPHAGOGASTRODUODENOSCOPY (EGD) WITH PROPOFOL;  Surgeon: [name], MD;  Location: [facility];  Service: Endoscopy;  Laterality: N/A;  Diabetic - oral meds   INDUCED ABORTION     POLYPECTOMY  10/09/2016   Procedure: POLYPECTOMY;  Surgeon: [name], MD;  Location: [place];  Service: Endoscopy;;   TUBAL LIGATION      Social History   Tobacco Use   Smoking status: Former    Packs/day: 1.00    Years: 18.00    Pack years: 18.00    Types: Cigarettes    Quit date: 06/12/2006    Years since quitting: 14.7   Smokeless tobacco: Never  Vaping Use   Vaping Use: Never used  Substance Use Topics   Alcohol use: No   Drug use: No     Medication list has been reviewed and updated.  Current Meds  Medication Sig   atorvastatin (LIPITOR) 80 MG tablet Take 80 mg by mouth daily.    cyclobenzaprine (FLEXERIL) 10 MG tablet TAKE 1 TABLET (10 MG TOTAL) BY MOUTH AT BEDTIME. RESTLESS LEG- USUALLY AT BEDTIME.   fenofibrate (TRICOR) 145 MG tablet TAKE 1 TABLET BY MOUTH EVERY DAY   gabapentin (NEURONTIN) 300 MG capsule Take 300 mg by mouth daily. Sciatic Nerve Pain- taking only at bedtime.   Ibuprofen 200 MG CAPS Take by mouth.   lisinopril-hydrochlorothiazide (ZESTORETIC) 10-12.5 MG tablet TAKE 1 TABLET BY MOUTH EVERY DAY   meclizine (ANTIVERT) 25 MG tablet  Take 12.5 mg by mouth daily. OTC daily.   metoprolol succinate (TOPROL-XL) 100 MG 24 hr tablet TAKE 1 TABLET (100 MG TOTAL) BY MOUTH EVERY EVENING. TAKE WITH OR IMMEDIATELY FOLLOWING A MEAL.   omeprazole (PRILOSEC) 20 MG capsule Take 20 mg by mouth daily. OTC   OTEZLA 30 MG TABS Take 1 tablet by mouth daily.   venlafaxine XR (EFFEXOR-XR) 37.5 MG 24 hr capsule TAKE 1 CAPSULE BY MOUTH DAILY WITH BREAKFAST.   VENTOLIN HFA 108 (90 Base) MCG/ACT inhaler TAKE 2 PUFFS BY MOUTH EVERY 6 HOURS AS NEEDED FOR WHEEZE OR SHORTNESS OF BREATH    PHQ 2/9 Scores 03/24/2021 11/19/2020 11/10/2020 08/06/2020  PHQ - 2 Score 0 0 0 4  PHQ- 9 Score 3 0 - 6    GAD 7 : Generalized Anxiety Score 03/24/2021 11/19/2020 08/06/2020 03/10/2020  Nervous, Anxious, on Edge 0 0  0 0  Control/stop worrying 0 0 0 0  Worry too much - different things 0 0 0 0  Trouble relaxing 0 0 0 0  Restless 0 0 0 0  Easily annoyed or irritable 0 0 0 0  Afraid - awful might happen 0 0 0 0  Total GAD 7 Score 0 0 0 0  Anxiety Difficulty - - - Not difficult at all    BP Readings from Last 3 Encounters:  03/24/21 104/76  11/19/20 112/78  11/10/20 112/78    Physical Exam Vitals and nursing note reviewed.  Constitutional:      General: She is not in acute distress.    Appearance: She is well-developed. She is obese.  HENT:     Head: Normocephalic and atraumatic.  Neck:     Vascular: No carotid bruit.  Cardiovascular:     Rate and Rhythm: Normal rate and regular rhythm.     Pulses: Normal pulses.  Pulmonary:     Effort: Pulmonary effort is normal. No respiratory distress.     Breath sounds: No wheezing or rhonchi.  Abdominal:     General: Abdomen is protuberant. Bowel sounds are decreased.     Palpations: Abdomen is soft.     Tenderness: There is no abdominal tenderness. There is no right CVA tenderness, left CVA tenderness, guarding or rebound.  Musculoskeletal:     Cervical back: Normal range of motion.     Right lower leg: No  edema.     Left lower leg: No edema.  Lymphadenopathy:     Cervical: No cervical adenopathy.  Skin:    General: Skin is warm and dry.     Findings: No rash.  Neurological:     Mental Status: She is alert and oriented to person, place, and time.  Psychiatric:        Mood and Affect: Mood normal.        Behavior: Behavior normal.    Wt Readings from Last 3 Encounters:  03/24/21 247 lb 3.2 oz (112.1 kg)  11/19/20 254 lb (115.2 kg)  11/10/20 253 lb 3.2 oz (114.9 kg)    BP 104/76   Pulse 78   Ht 5\' 7"  (1.702 m)   Wt 247 lb 3.2 oz (112.1 kg)   SpO2 96%   BMI 38.72 kg/m   Assessment and Plan: 1. Type II diabetes mellitus with complication (HCC) Clinically stable by exam and report without s/s of hypoglycemia. DM complicated by hypertension. Doing well on diet alone - continue off of Farxiga. - POCT HgB A1C = 6.2 down from 6.9  2. Essential hypertension Clinically stable exam with well controlled BP. Tolerating medications without side effects at this time. Pt to continue current regimen and low sodium diet; benefits of regular exercise as able discussed.  3. Chronic nausea Uncertain cause - [place] the most likely but she has been on this daily for several years. Recommend trial off for 2 weeks - if nausea does not resolve, will need further work up with UA, pancreatic enzymes, H Pylori.  4. Need for immunization against influenza - Flu Vaccine QUAD High Dose(Fluad)  5. Psoriatic arthritis ([place]) Hold Otezla briefly - if nausea resolves, will need to discuss alternative therapy with Rheum.   Partially dictated using Editor, commissioning. Any errors are unintentional.  Halina Maidens, MD [facility]  03/24/2021

## 2021-04-08 ENCOUNTER — Other Ambulatory Visit: Payer: Self-pay | Admitting: Internal Medicine

## 2021-04-08 DIAGNOSIS — G2581 Restless legs syndrome: Secondary | ICD-10-CM

## 2021-04-08 DIAGNOSIS — H8113 Benign paroxysmal vertigo, bilateral: Secondary | ICD-10-CM

## 2021-04-08 DIAGNOSIS — I10 Essential (primary) hypertension: Secondary | ICD-10-CM

## 2021-04-09 NOTE — Telephone Encounter (Signed)
Requested medication (s) are due for refill today: yes  Requested medication (s) are on the active medication list: yes  Last refill: 03/09/21  # 30  0 refills  Future visit scheduled yes  07/26/20  Notes to clinic: Not delegated   Additional requests sent by Interface not due until 05/30/21 Refills available  Requested Prescriptions  Pending Prescriptions Disp Refills   metoprolol succinate (TOPROL-XL) 100 MG 24 hr tablet [Pharmacy Med Name: METOPROLOL SUCC ER 100 MG TAB] 90 tablet 0    Sig: TAKE 1 TABLET (100 MG TOTAL) BY MOUTH EVERY EVENING. TAKE WITH OR IMMEDIATELY FOLLOWING A MEAL.     Cardiovascular:  Beta Blockers Passed - 04/08/2021  3:10 PM      Passed - Last BP in normal range    BP Readings from Last 1 Encounters:  03/24/21 104/76          Passed - Last Heart Rate in normal range    Pulse Readings from Last 1 Encounters:  03/24/21 78          Passed - Valid encounter within last 6 months    Recent Outpatient Visits           2 weeks ago Type II diabetes mellitus with complication [facility])   [facility] Glean [facility], [facility]   4 months ago Type II diabetes mellitus with complication [facility])   [facility] Glean [facility], MD   8 months ago Type II diabetes mellitus with complication [facility])   [facility] Glean [facility], MD   1 year ago Type II diabetes mellitus with complication [facility])   [facility] Glean Hess, MD   1 year ago Restless leg syndrome   [facility] Glean Hess, MD       Future Appointments             In 3 months Army Melia Jesse Sans, MD [facility], [place]   In 4 months Glean Hess, MD [facility], [facility]             venlafaxine XR (EFFEXOR-XR) 37.5 MG 24 hr capsule [Pharmacy Med Name: VENLAFAXINE HCL ER 37.5 MG CAP] 90 capsule 0    Sig: TAKE 1 CAPSULE BY MOUTH DAILY WITH BREAKFAST.     Psychiatry: Antidepressants - SNRI - desvenlafaxine & venlafaxine Failed -  04/08/2021  3:10 PM      Failed - LDL in normal range and within 360 days    LDL Chol Calc (NIH)  Date Value Ref Range Status  11/19/2020 111 (H) 0 - 99 mg/dL Final          Failed - Triglycerides in normal range and within 360 days    Triglycerides  Date Value Ref Range Status  11/19/2020 294 (H) 0 - 149 mg/dL Final          Passed - Total Cholesterol in normal range and within 360 days    Cholesterol, Total  Date Value Ref Range Status  11/19/2020 197 100 - 199 mg/dL Final          Passed - Last BP in normal range    BP Readings from Last 1 Encounters:  03/24/21 104/76          Passed - Valid encounter within last 6 months    Recent Outpatient Visits           2 weeks ago Type II diabetes mellitus with complication ([place])  [facility] Glean Hess, MD   4 months ago Type II diabetes mellitus with complication [facility])   [facility] Glean Hess, MD   8 months ago Type II diabetes mellitus with complication [facility])   [facility] Glean Hess, MD   1 year ago Type II diabetes mellitus with complication [facility])   [facility] Glean [facility], MD   1 year ago Restless leg syndrome   [facility] Glean [facility], MD       Future Appointments             In 3 months Army Melia Jesse Sans, MD [facility], [place]   In 4 months Glean Hess, MD [facility], [facility]             cyclobenzaprine (FLEXERIL) 10 MG tablet [Pharmacy Med Name: CYCLOBENZAPRINE 10 MG TABLET] 30 tablet 0    Sig: TAKE 1 TABLET (10 MG TOTAL) BY MOUTH AT BEDTIME. RESTLESS LEG- USUALLY AT BEDTIME.     Not Delegated - Analgesics:  Muscle Relaxants Failed - 04/08/2021  3:10 PM      Failed - This refill cannot be delegated      Passed - Valid encounter within last 6 months    Recent Outpatient Visits           2 weeks ago Type II diabetes mellitus with complication [facility])   [facility] Glean [facility], MD    4 months ago Type II diabetes mellitus with complication [facility])   [facility] Glean [facility], MD   8 months ago Type II diabetes mellitus with complication [facility])   [facility] Glean [facility], MD   1 year ago Type II diabetes mellitus with complication [facility])   [facility] Glean [facility], MD   1 year ago Restless leg syndrome   [facility] Glean Hess, MD       Future Appointments             In 3 months Army Melia Jesse Sans, MD [facility], [place]   In 4 months Army Melia Jesse Sans, MD [facility], [facility]

## 2021-05-20 ENCOUNTER — Other Ambulatory Visit: Payer: Self-pay | Admitting: Internal Medicine

## 2021-05-20 DIAGNOSIS — E785 Hyperlipidemia, unspecified: Secondary | ICD-10-CM

## 2021-05-20 DIAGNOSIS — E1169 Type 2 diabetes mellitus with other specified complication: Secondary | ICD-10-CM

## 2021-05-20 MED ORDER — ATORVASTATIN CALCIUM 80 MG PO TABS: 80.0000 mg | ORAL_TABLET | Freq: Every day | ORAL | 1 refills | Status: DC

## 2021-05-20 NOTE — Telephone Encounter (Signed)
Requested medication (s) are due for refill today: yes  Requested medication (s) are on the active medication list: yes  Last refill:  12/19/2018  Future visit scheduled: Yes  Notes to clinic:  historical provider, please advise     Requested Prescriptions  Pending Prescriptions Disp Refills   atorvastatin (LIPITOR) 80 MG tablet [Pharmacy Med Name: ATORVASTATIN 80 MG TABLET] 30 tablet     Sig: TAKE 1 TABLET BY MOUTH EVERY DAY     Cardiovascular:  Antilipid - Statins Failed - 05/20/2021 11:51 AM      Failed - LDL in normal range and within 360 days    LDL Chol Calc (NIH)  Date Value Ref Range Status  11/19/2020 111 (H) 0 - 99 mg/dL Final          Failed - HDL in normal range and within 360 days    HDL  Date Value Ref Range Status  11/19/2020 35 (L) >39 mg/dL Final          Failed - Triglycerides in normal range and within 360 days    Triglycerides  Date Value Ref Range Status  11/19/2020 294 (H) 0 - 149 mg/dL Final          Passed - Total Cholesterol in normal range and within 360 days    Cholesterol, Total  Date Value Ref Range Status  11/19/2020 197 100 - 199 mg/dL Final          Passed - Patient is not pregnant      Passed - Valid encounter within last 12 months    Recent Outpatient Visits           1 month ago Type II diabetes mellitus with complication [facility])   [facility] Glean [facility], MD   6 months ago Type II diabetes mellitus with complication [facility])   [facility] Glean Hess, MD   9 months ago Type II diabetes mellitus with complication [facility])   [facility] Glean Hess, MD   1 year ago Type II diabetes mellitus with complication [facility])   [facility] Glean Hess, MD   1 year ago Restless leg syndrome   [facility] Glean Hess, MD       Future Appointments             In 3 days Glean Hess, MD [facility], [place]   In 2 months Glean Hess, MD [facility], [place]   In 2 months Army Melia, Jesse Sans, MD [facility], [facility]

## 2021-05-20 NOTE — Telephone Encounter (Signed)
Apt was scheduled for Monday 05/23/21. [facility]?

## 2021-05-23 ENCOUNTER — Ambulatory Visit: Payer: Medicare Other | Admitting: Internal Medicine

## 2021-06-08 ENCOUNTER — Other Ambulatory Visit: Payer: Self-pay

## 2021-06-08 ENCOUNTER — Ambulatory Visit
Admission: RE | Admit: 2021-06-08 | Discharge: 2021-06-08 | Disposition: A | Discharge status: Home / Self Care | Payer: Medicare Other | Source: Ambulatory Visit | Attending: Physician Assistant | Admitting: Physician Assistant

## 2021-06-08 VITALS — BP 134/73 | HR 88 | Temp 98.3°F | Resp 20 | Wt 247.1 lb

## 2021-06-08 DIAGNOSIS — L03317 Cellulitis of buttock: Secondary | ICD-10-CM

## 2021-06-08 MED ORDER — CEPHALEXIN 500 MG PO CAPS: 500.0000 mg | ORAL_CAPSULE | Freq: Four times a day (QID) | ORAL | 0 refills | Status: AC

## 2021-06-08 MED ORDER — MUPIROCIN 2 % EX OINT: 1.0000 "application " | TOPICAL_OINTMENT | Freq: Two times a day (BID) | CUTANEOUS | 0 refills | Status: DC

## 2021-06-08 NOTE — ED Triage Notes (Signed)
Patient is here for "Insect bite" on buttocks. First noticed "Wednesday morning". Small at first, hard in center, now bigger, more swollen with ? Redness. Concerned with this being a "insect bite". No fever.

## 2021-06-08 NOTE — ED Provider Notes (Signed)
[facility]    CSN: [id number] Arrival date & time: 06/08/21  0945      History   Chief Complaint Chief Complaint  Patient presents with   Insect Bite    Appt: 10am.    HPI Diane Morris is a 70 y.o. female presenting for an area of redness and swelling of the left buttocks.  She says she noticed it yesterday morning.  She says it was small and hard at first but now it is larger and the skin is weeping.  She reports thinking it could be a spider bite.  She says she has put warm compresses on the area but it has not helped.  Reports pain radiating into her left hip and swollen lymph nodes of the groin.  No associated fever or red streaks.  No other complaints.  HPI  Past Medical History:  Diagnosis Date   BPPV (benign paroxysmal positional vertigo)    Depression    Depression 11/09/2015   Diabetes mellitus type 2, noninsulin dependent (HCC)    Dysrhythmia    tachycardia - controlled with metoprolol   GERD (gastroesophageal reflux disease)    Hepatitis A    as teenager. treated   Hepatitis B 12/2015   positive test, patient states she does not have hepatitis B, last panel was negative which was the 3rd panel   Hx of blood clots 2016   left calf   Hypercholesteremia    Hypertension    Long term current use of opiate analgesic 02/14/2017   Long term prescription opiate use 02/14/2017   Opiate use 02/14/2017   Psoriatic arthritis ([place])    Sciatica of left side    Vertigo    when anxious    Patient Active Problem List   Diagnosis Date Noted   Mood disorder ([place]) 12/08/2019   Hepatic steatosis 12/07/2019   Elevated alkaline phosphatase level 08/08/2019   Hyperlipidemia associated with type 2 diabetes mellitus ([place]) 08/06/2019   Essential hypertension 01/31/2019   GERD without esophagitis 01/31/2019   Type II diabetes mellitus with complication ([place]) 78/58/8502   Morbid obesity ([place]) 01/31/2019   Benign paroxysmal positional vertigo due to bilateral  vestibular disorder 01/31/2019   History of gastritis    Vitamin D deficiency 02/19/2017   Chronic low back pain (Primary Area of Pain) (L>R) 02/14/2017   Chronic hip pain (Secondary Area of Pain) (L>R) 02/14/2017   Chronic right shoulder pain (Tertiary Area of Pain) 02/14/2017   Bilateral chronic knee pain (Fourth Area of Pain) (L>R) 02/14/2017   Chronic neck pain 02/14/2017   Chronic upper extremity pain 02/14/2017   Chronic sacroiliac joint pain 02/14/2017   Adenomatous polyp of colon    CTS (carpal tunnel syndrome) 08/28/2016   Hepatitis B surface antigen positive 11/19/2015   Enthesitis 11/19/2015   Facet joint disease of lumbosacral region ([place]) 11/19/2015   Chronic bilateral low back pain with left-sided sciatica 11/09/2015   Psoriasis 11/09/2015   Psoriatic arthritis ([place]) 11/09/2015   Tendinitis of right foot 11/09/2015    Past Surgical History:  Procedure Laterality Date   COLONOSCOPY     COLONOSCOPY WITH PROPOFOL N/A 10/09/2016   Procedure: COLONOSCOPY WITH PROPOFOL;  Surgeon: [name], MD;  Location: [place];  Service: Endoscopy;  Laterality: N/A;  Diabetic - oral meds   DILATION AND CURETTAGE OF UTERUS     ESOPHAGOGASTRODUODENOSCOPY (EGD) WITH PROPOFOL N/A 01/03/2018   Procedure: ESOPHAGOGASTRODUODENOSCOPY (EGD) WITH PROPOFOL;  Surgeon: [name], MD;  Location: [facility];  Service: Endoscopy;  Laterality: N/A;  Diabetic - oral meds   INDUCED ABORTION     POLYPECTOMY  10/09/2016   Procedure: POLYPECTOMY;  Surgeon: [name], MD;  Location: [place];  Service: Endoscopy;;   TUBAL LIGATION      OB History   No obstetric history on file.      Home Medications    Prior to Admission medications   Medication Sig Start Date End Date Taking? Authorizing Provider  atorvastatin (LIPITOR) 80 MG tablet Take 1 tablet (80 mg total) by mouth daily. 05/20/21  Yes Glean Hess, MD  cephALEXin (KEFLEX) 500 MG capsule Take 1 capsule  (500 mg total) by mouth 4 (four) times daily for 7 days. 06/08/21 06/15/21 Yes Laurene Footman B, PA-C  Cetirizine HCl (ALLERGY, CETIRIZINE, PO) Take by mouth.   Yes [provider]  fenofibrate (TRICOR) 145 MG tablet TAKE 1 TABLET BY MOUTH EVERY DAY 02/04/21  Yes Glean Hess, MD  gabapentin (NEURONTIN) 300 MG capsule Take 300 mg by mouth daily. Sciatic Nerve Pain- taking only at bedtime. 11/19/15  Yes [provider]  Ibuprofen 200 MG CAPS Take by mouth.   Yes [provider]  lisinopril-hydrochlorothiazide (ZESTORETIC) 10-12.5 MG tablet TAKE 1 TABLET BY MOUTH EVERY DAY 03/08/21  Yes Glean Hess, MD  meclizine (ANTIVERT) 25 MG tablet Take 12.5 mg by mouth daily. OTC daily.   Yes [provider]  metoprolol succinate (TOPROL-XL) 100 MG 24 hr tablet TAKE 1 TABLET (100 MG TOTAL) BY MOUTH EVERY EVENING. TAKE WITH OR IMMEDIATELY FOLLOWING A MEAL. 04/11/21  Yes Glean Hess, MD  mupirocin ointment (BACTROBAN) 2 % Apply 1 application topically 2 (two) times daily. 06/08/21  Yes Danton Clap, PA-C  omeprazole (PRILOSEC) 20 MG capsule Take 20 mg by mouth daily. OTC   Yes [provider]  OTEZLA 30 MG TABS Take 1 tablet by mouth daily. 01/15/20  Yes [provider]  venlafaxine XR (EFFEXOR-XR) 37.5 MG 24 hr capsule TAKE 1 CAPSULE BY MOUTH DAILY WITH BREAKFAST. 04/11/21  Yes Glean Hess, MD  VENTOLIN HFA 108 (90 Base) MCG/ACT inhaler TAKE 2 PUFFS BY MOUTH EVERY 6 HOURS AS NEEDED FOR WHEEZE OR SHORTNESS OF BREATH 03/08/21  Yes Glean Hess, MD  cyclobenzaprine (FLEXERIL) 10 MG tablet TAKE 1 TABLET (10 MG TOTAL) BY MOUTH AT BEDTIME. RESTLESS LEG- USUALLY AT BEDTIME. 04/11/21   Glean Hess, MD  FARXIGA 10 MG TABS tablet TAKE 1 TABLET BY MOUTH EVERY DAY 03/08/21   Glean Hess, MD    Family History Family History  Problem Relation Age of Onset   Congestive Heart Failure Mother    Emphysema Mother    Heart disease Father     Hypertension Sister    Cancer Brother    Hypertension Brother    Breast cancer Neg Hx     Social History Social History   Tobacco Use   Smoking status: Former    Packs/day: 1.00    Years: 18.00    Pack years: 18.00    Types: Cigarettes    Quit date: 06/12/2006    Years since quitting: 15.0   Smokeless tobacco: Never  Vaping Use   Vaping Use: Never used  Substance Use Topics   Alcohol use: No   Drug use: No     Allergies   Contrast media [iodinated contrast media], Bee venom, Codeine, Shellfish allergy, and Sodium clavulanate   Review of Systems Review of  Systems  Constitutional:  Negative for fatigue and fever.  Gastrointestinal:  Negative for abdominal pain.  Musculoskeletal:  Positive for arthralgias (Left hip). Negative for gait problem and joint swelling.  Skin:  Positive for color change and rash.  Neurological:  Negative for weakness.  Hematological:  Positive for adenopathy (Left groin).    Physical Exam Triage Vital Signs ED Triage Vitals [06/08/21 1008]  Enc Vitals Group     BP 134/73     Pulse Rate 88     Resp 20     Temp 98.3 F (36.8 C)     Temp Source Oral     SpO2 100 %     Weight 247 lb 2.2 oz (112.1 kg)     Height      Head Circumference      Peak Flow      Pain Score 3     Pain Loc      Pain Edu?      Excl. in [place]?    No data found.  Updated Vital Signs BP 134/73 (BP Location: Left Arm)    Pulse 88    Temp 98.3 F (36.8 C) (Oral)    Resp 20    Wt 247 lb 2.2 oz (112.1 kg)    SpO2 100%    BMI 38.71 kg/m      Physical Exam Vitals and nursing note reviewed.  Constitutional:      General: She is not in acute distress.    Appearance: Normal appearance. She is not ill-appearing or toxic-appearing.  HENT:     Head: Normocephalic and atraumatic.  Eyes:     General: No scleral icterus.       Right eye: No discharge.        Left eye: No discharge.     Conjunctiva/sclera: Conjunctivae normal.  Cardiovascular:     Rate and Rhythm:  Normal rate and regular rhythm.  Pulmonary:     Effort: Pulmonary effort is normal. No respiratory distress.  Musculoskeletal:     Cervical back: Neck supple.  Skin:    General: Skin is dry.     Comments: LEFT BUTTOCKS: 4 cm x 4 cm area of erythema and induration without fluctuance. Area is weeping light yellowish fluid  Neurological:     General: No focal deficit present.     Mental Status: She is alert. Mental status is at baseline.     Motor: No weakness.     Gait: Gait normal.  Psychiatric:        Mood and Affect: Mood normal.        Behavior: Behavior normal.        Thought Content: Thought content normal.     UC Treatments / Results  Labs (all labs ordered are listed, but only abnormal results are displayed) Labs Reviewed - No data to display  EKG   Radiology No results found.  Procedures Procedures (including critical care time)  Medications Ordered in UC Medications - No data to display  Initial Impression / Assessment and Plan / UC Course  I have reviewed the triage vital signs and the nursing notes.  Pertinent labs & imaging results that were available during my care of the patient were reviewed by me and considered in my medical decision making (see chart for details).  70 year old female presenting for an area of redness and swelling of left buttocks.  It is also painful and the skin is weeping.  No fevers.  Pain does radiate into  her left hip though and she has small lymph nodes of left inguinal region.  Clinical examination consistent with cellulitis of the buttocks.  I have sent Keflex to pharmacy as well as mupirocin ointment.  Reviewed wound care with patient.  Reviewed return and ER precautions.  Final Clinical Impressions(s) / UC Diagnoses   Final diagnoses:  Cellulitis of buttock     Discharge Instructions      -You have an infection.  I have sent antibiotics to the pharmacy as well as an ointment. - Clean the area with soap and water and  dry well and apply the ointment twice daily. - Tylenol for discomfort. - You should be feeling a lot better in the next 2 to 3 days. - You should be seen again if the area grows in size or becomes more painful or you develop a fever or are not feeling better in the next 3 days.     ED Prescriptions     Medication Sig Dispense Auth. Provider   cephALEXin (KEFLEX) 500 MG capsule Take 1 capsule (500 mg total) by mouth 4 (four) times daily for 7 days. 28 capsule Laurene Footman B, PA-C   mupirocin ointment (BACTROBAN) 2 % Apply 1 application topically 2 (two) times daily. 22 g Danton Clap, PA-C      PDMP not reviewed this encounter.   Danton Clap, PA-C 06/08/21 1159

## 2021-06-08 NOTE — Discharge Instructions (Signed)
-  You have an infection.  I have sent antibiotics to the pharmacy as well as an ointment. - Clean the area with soap and water and dry well and apply the ointment twice daily. - Tylenol for discomfort. - You should be feeling a lot better in the next 2 to 3 days. - You should be seen again if the area grows in size or becomes more painful or you develop a fever or are not feeling better in the next 3 days.

## 2021-07-01 ENCOUNTER — Other Ambulatory Visit: Payer: Self-pay | Admitting: Internal Medicine

## 2021-07-01 DIAGNOSIS — E118 Type 2 diabetes mellitus with unspecified complications: Secondary | ICD-10-CM

## 2021-07-01 NOTE — Telephone Encounter (Signed)
Requested medication (s) are due for refill today: yes  Requested medication (s) are on the active medication list: yes  Last refill:  04/13/21  Future visit scheduled: 07/26/2020  Notes to clinic:  OV note from 03/24/21 states pt no longer taking, doing well on diet, it is still on current med list and looks like pt filled 04/13/21. Has upcoming visit 07/26/21, please assess.  Requested Prescriptions  Pending Prescriptions Disp Refills   FARXIGA 10 MG TABS tablet [Pharmacy Med Name: FARXIGA 10 MG TABLET] 90 tablet 0    Sig: TAKE 1 TABLET BY MOUTH EVERY DAY     Endocrinology:  Diabetes - SGLT2 Inhibitors Failed - 07/01/2021  1:24 AM      Failed - LDL in normal range and within 360 days    LDL Chol Calc (NIH)  Date Value Ref Range Status  11/19/2020 111 (H) 0 - 99 mg/dL Final          Passed - Cr in normal range and within 360 days    Creatinine, Ser  Date Value Ref Range Status  11/19/2020 0.84 0.57 - 1.00 mg/dL Final          Passed - HBA1C is between 0 and 7.9 and within 180 days    Hemoglobin A1C  Date Value Ref Range Status  03/24/2021 6.2 (A) 4.0 - 5.6 % Final  08/13/2018 6.4  Final   Hgb A1c MFr Bld  Date Value Ref Range Status  11/19/2020 6.9 (H) 4.8 - 5.6 % Final    Comment:             Prediabetes: 5.7 - 6.4          Diabetes: >6.4          Glycemic control for adults with diabetes: <7.0           Passed - eGFR in normal range and within 360 days    GFR calc Af Amer  Date Value Ref Range Status  08/06/2020 106 >59 mL/min/1.73 Final    Comment:    **In accordance with recommendations from the NKF-ASN Task force,**   [facility] is in the process of updating its eGFR calculation to the   2021 CKD-EPI creatinine equation that estimates kidney function   without a race variable.    GFR calc non Af Amer  Date Value Ref Range Status  08/06/2020 92 >59 mL/min/1.73 Final   eGFR  Date Value Ref Range Status  11/19/2020 76 >59 mL/min/1.73 Final           Passed - Valid encounter within last 6 months    Recent Outpatient Visits           3 months ago Type II diabetes mellitus with complication [facility])   [facility] Glean Hess, MD   7 months ago Type II diabetes mellitus with complication [facility])   [facility] Glean Hess, MD   10 months ago Type II diabetes mellitus with complication [facility])   [facility] Glean Hess, MD   1 year ago Type II diabetes mellitus with complication [facility])   [facility] Glean Hess, MD   1 year ago Restless leg syndrome   [facility] Glean Hess, MD       Future Appointments             In 3 weeks Army Melia Jesse Sans, MD [facility], [place]   In 1 month Glean Hess,  MD [place]

## 2021-07-15 ENCOUNTER — Other Ambulatory Visit: Payer: Self-pay | Admitting: Internal Medicine

## 2021-07-15 DIAGNOSIS — I10 Essential (primary) hypertension: Secondary | ICD-10-CM

## 2021-07-15 DIAGNOSIS — R0609 Other forms of dyspnea: Secondary | ICD-10-CM

## 2021-07-15 NOTE — Telephone Encounter (Signed)
Requested medication (s) are due for refill today: yes  Requested medication (s) are on the active medication list: yes  Last refill:  03/08/21 #90/0  Future visit scheduled: yes  Notes to clinic:  Unable to refill per protocol due to failed labs, no updated results.      Requested Prescriptions  Pending Prescriptions Disp Refills   lisinopril-hydrochlorothiazide (ZESTORETIC) 10-12.5 MG tablet [Pharmacy Med Name: LISINOPRIL-HCTZ 10-12.5 MG TAB] 90 tablet 0    Sig: TAKE 1 TABLET BY MOUTH EVERY DAY     Cardiovascular:  ACEI + Diuretic Combos Failed - 07/15/2021  1:29 PM      Failed - Na in normal range and within 180 days    Sodium  Date Value Ref Range Status  11/19/2020 135 134 - 144 mmol/L Final          Failed - K in normal range and within 180 days    Potassium  Date Value Ref Range Status  11/19/2020 5.0 3.5 - 5.2 mmol/L Final          Failed - Cr in normal range and within 180 days    Creatinine, Ser  Date Value Ref Range Status  11/19/2020 0.84 0.57 - 1.00 mg/dL Final          Failed - eGFR is 30 or above and within 180 days    GFR calc Af Amer  Date Value Ref Range Status  08/06/2020 106 >59 mL/min/1.73 Final    Comment:    **In accordance with recommendations from the NKF-ASN Task force,**   [facility] is in the process of updating its eGFR calculation to the   2021 CKD-EPI creatinine equation that estimates kidney function   without a race variable.    GFR calc non Af Amer  Date Value Ref Range Status  08/06/2020 92 >59 mL/min/1.73 Final   eGFR  Date Value Ref Range Status  11/19/2020 76 >59 mL/min/1.73 Final          Passed - Patient is not pregnant      Passed - Last BP in normal range    BP Readings from Last 1 Encounters:  06/08/21 134/73          Passed - Valid encounter within last 6 months    Recent Outpatient Visits           3 months ago Type II diabetes mellitus with complication [facility])   [facility] Glean Hess,  MD   7 months ago Type II diabetes mellitus with complication [facility])   [facility] Glean Hess, MD   11 months ago Type II diabetes mellitus with complication [facility])   [facility] Glean Hess, MD   1 year ago Type II diabetes mellitus with complication [facility])   [facility] Glean Hess, MD   1 year ago Restless leg syndrome   [facility] Glean Hess, MD       Future Appointments             In 1 week Army Melia Jesse Sans, MD [facility]   In 3 weeks Army Melia Jesse Sans, MD [facility], [facility]            Signed Prescriptions Disp Refills   VENTOLIN HFA 108 (90 Base) MCG/ACT inhaler 18 each 2    Sig: TAKE 2 PUFFS BY MOUTH EVERY 6 HOURS AS NEEDED FOR WHEEZE OR SHORTNESS OF BREATH     Pulmonology:  Beta Agonists 2 Passed - 07/15/2021  1:29 PM      Passed - Last BP in normal range    BP Readings from Last 1 Encounters:  06/08/21 134/73          Passed - Last Heart Rate in normal range    Pulse Readings from Last 1 Encounters:  06/08/21 88          Passed - Valid encounter within last 12 months    Recent Outpatient Visits           3 months ago Type II diabetes mellitus with complication [facility])   [facility] Glean Hess, MD   7 months ago Type II diabetes mellitus with complication [facility])   [facility] Glean Hess, MD   11 months ago Type II diabetes mellitus with complication [facility])   [facility] Glean Hess, MD   1 year ago Type II diabetes mellitus with complication [facility])   [facility] Glean Hess, MD   1 year ago Restless leg syndrome   [facility] Glean Hess, MD       Future Appointments             In 1 week Army Melia Jesse Sans, MD [facility], [place]   In 3 weeks Glean Hess, MD [facility], [facility]             metoprolol succinate (TOPROL-XL) 100 MG 24 hr tablet 90 tablet 0     Sig: TAKE 1 TABLET (100 MG TOTAL) BY MOUTH EVERY EVENING. TAKE WITH OR IMMEDIATELY FOLLOWING A MEAL.     Cardiovascular:  Beta Blockers Passed - 07/15/2021  1:29 PM      Passed - Last BP in normal range    BP Readings from Last 1 Encounters:  06/08/21 134/73          Passed - Last Heart Rate in normal range    Pulse Readings from Last 1 Encounters:  06/08/21 88          Passed - Valid encounter within last 6 months    Recent Outpatient Visits           3 months ago Type II diabetes mellitus with complication [facility])   [facility] Glean Hess, MD   7 months ago Type II diabetes mellitus with complication [facility])   [facility] Glean Hess, MD   11 months ago Type II diabetes mellitus with complication [facility])   [facility] Glean Hess, MD   1 year ago Type II diabetes mellitus with complication [facility])   [facility] Glean Hess, MD   1 year ago Restless leg syndrome   [facility] Glean Hess, MD       Future Appointments             In 1 week Army Melia Jesse Sans, MD [facility], [place]   In 3 weeks Army Melia Jesse Sans, MD [facility], [facility]

## 2021-07-15 NOTE — Telephone Encounter (Signed)
Requested Prescriptions  Pending Prescriptions Disp Refills   VENTOLIN HFA 108 (90 Base) MCG/ACT inhaler [Pharmacy Med Name: VENTOLIN HFA 90 MCG INHALER] 18 each 1    Sig: TAKE 2 PUFFS BY MOUTH EVERY 6 HOURS AS NEEDED FOR WHEEZE OR SHORTNESS OF BREATH     Pulmonology:  Beta Agonists 2 Passed - 07/15/2021  1:29 PM      Passed - Last BP in normal range    BP Readings from Last 1 Encounters:  06/08/21 134/73         Passed - Last Heart Rate in normal range    Pulse Readings from Last 1 Encounters:  06/08/21 88         Passed - Valid encounter within last 12 months    Recent Outpatient Visits          3 months ago Type II diabetes mellitus with complication [facility])   [facility] Glean Hess, MD   7 months ago Type II diabetes mellitus with complication [facility])   [facility] Glean Hess, MD   11 months ago Type II diabetes mellitus with complication [facility])   [facility] Glean Hess, MD   1 year ago Type II diabetes mellitus with complication [facility])   [facility] Glean Hess, MD   1 year ago Restless leg syndrome   [facility] Glean Hess, MD      Future Appointments            In 1 week Army Melia Jesse Sans, MD [facility], [place]   In 3 weeks Glean Hess, MD [facility], [facility]            metoprolol succinate (TOPROL-XL) 100 MG 24 hr tablet [Pharmacy Med Name: METOPROLOL SUCC ER 100 MG TAB] 90 tablet 0    Sig: TAKE 1 TABLET (100 MG TOTAL) BY MOUTH EVERY EVENING. TAKE WITH OR IMMEDIATELY FOLLOWING A MEAL.     Cardiovascular:  Beta Blockers Passed - 07/15/2021  1:29 PM      Passed - Last BP in normal range    BP Readings from Last 1 Encounters:  06/08/21 134/73         Passed - Last Heart Rate in normal range    Pulse Readings from Last 1 Encounters:  06/08/21 88         Passed - Valid encounter within last 6 months    Recent Outpatient Visits          3 months ago Type II  diabetes mellitus with complication [facility])   [facility] Glean Hess, MD   7 months ago Type II diabetes mellitus with complication [facility])   [facility] Glean Hess, MD   11 months ago Type II diabetes mellitus with complication [facility])   [facility] Glean Hess, MD   1 year ago Type II diabetes mellitus with complication [facility])   [facility] Glean [facility], MD   1 year ago Restless leg syndrome   [facility] Glean Hess, MD      Future Appointments            In 1 week Army Melia Jesse Sans, MD [facility], [place]   In 3 weeks Army Melia Jesse Sans, MD [facility], [facility]            lisinopril-hydrochlorothiazide (ZESTORETIC) 10-12.5 MG tablet [Pharmacy Med Name: LISINOPRIL-HCTZ 10-12.5 MG TAB]  90 tablet 0    Sig: TAKE 1 TABLET BY MOUTH EVERY DAY     Cardiovascular:  ACEI + Diuretic Combos Failed - 07/15/2021  1:29 PM      Failed - Na in normal range and within 180 days    Sodium  Date Value Ref Range Status  11/19/2020 135 134 - 144 mmol/L Final         Failed - K in normal range and within 180 days    Potassium  Date Value Ref Range Status  11/19/2020 5.0 3.5 - 5.2 mmol/L Final         Failed - Cr in normal range and within 180 days    Creatinine, Ser  Date Value Ref Range Status  11/19/2020 0.84 0.57 - 1.00 mg/dL Final         Failed - eGFR is 30 or above and within 180 days    GFR calc Af Amer  Date Value Ref Range Status  08/06/2020 106 >59 mL/min/1.73 Final    Comment:    **In accordance with recommendations from the NKF-ASN Task force,**   [facility] is in the process of updating its eGFR calculation to the   2021 CKD-EPI creatinine equation that estimates kidney function   without a race variable.    GFR calc non Af Amer  Date Value Ref Range Status  08/06/2020 92 >59 mL/min/1.73 Final   eGFR  Date Value Ref Range Status  11/19/2020 76 >59 mL/min/1.73 Final         Passed  - Patient is not pregnant      Passed - Last BP in normal range    BP Readings from Last 1 Encounters:  06/08/21 134/73         Passed - Valid encounter within last 6 months    Recent Outpatient Visits          3 months ago Type II diabetes mellitus with complication [facility])   [facility] Glean Hess, MD   7 months ago Type II diabetes mellitus with complication [facility]   [facility]   11 months ago Type II diabetes mellitus with complication [facility])   [facility] Glean Hess, MD   1 year ago Type II diabetes mellitus with complication [facility])   [facility] Glean Hess, MD   1 year ago Restless leg syndrome   [facility] Glean Hess, MD      Future Appointments            In 1 week Army Melia Jesse Sans, MD [facility], [place]   In 3 weeks Army Melia Jesse Sans, MD [facility], [facility]

## 2021-07-26 ENCOUNTER — Encounter: Payer: Self-pay | Admitting: Internal Medicine

## 2021-07-26 ENCOUNTER — Encounter: Payer: Medicare HMO | Admitting: Internal Medicine

## 2021-07-26 ENCOUNTER — Telehealth: Payer: Self-pay

## 2021-07-26 ENCOUNTER — Other Ambulatory Visit: Payer: Self-pay

## 2021-07-26 VITALS — Ht 67.0 in

## 2021-07-26 NOTE — Progress Notes (Signed)
Date:  07/26/2021   Name:  Diane Morris   DOB:  Oct 11, 1950   MRN:  [id number]   Chief Complaint: Diabetes and Hypertension  Diabetes  Hypertension   Lab Results  Component Value Date   NA 135 11/19/2020   K 5.0 11/19/2020   CO2 21 11/19/2020   GLUCOSE 110 (H) 11/19/2020   BUN 16 11/19/2020   CREATININE 0.84 11/19/2020   CALCIUM 10.5 (H) 11/19/2020   EGFR 76 11/19/2020   GFRNONAA 92 08/06/2020   Lab Results  Component Value Date   CHOL 197 11/19/2020   HDL 35 (L) 11/19/2020   LDLCALC 111 (H) 11/19/2020   TRIG 294 (H) 11/19/2020   CHOLHDL 5.6 (H) 11/19/2020   Lab Results  Component Value Date   TSH 2.110 08/06/2019   Lab Results  Component Value Date   HGBA1C 6.2 (A) 03/24/2021   Lab Results  Component Value Date   WBC 11.6 (H) 08/06/2020   HGB 15.1 08/06/2020   HCT 45.3 08/06/2020   MCV 85 08/06/2020   PLT 350 08/06/2020   Lab Results  Component Value Date   ALT 24 11/19/2020   AST 22 11/19/2020   ALKPHOS 153 (H) 11/19/2020   BILITOT 0.4 11/19/2020   Lab Results  Component Value Date   25OHVITD2 <1.0 02/14/2017   25OHVITD3 11 02/14/2017     Review of Systems  Patient Active Problem List   Diagnosis Date Noted   Mood disorder ([place]) 12/08/2019   Hepatic steatosis 12/07/2019   Elevated alkaline phosphatase level 08/08/2019   Hyperlipidemia associated with type 2 diabetes mellitus ([place]) 08/06/2019   Essential hypertension 01/31/2019   GERD without esophagitis 01/31/2019   Type II diabetes mellitus with complication ([place]) 14/03/3012   Morbid obesity ([place]) 01/31/2019   Benign paroxysmal positional vertigo due to bilateral vestibular disorder 01/31/2019   History of gastritis    Vitamin D deficiency 02/19/2017   Chronic low back pain (Primary Area of Pain) (L>R) 02/14/2017   Chronic hip pain (Secondary Area of Pain) (L>R) 02/14/2017   Chronic right shoulder pain (Tertiary Area of Pain) 02/14/2017   Bilateral chronic knee pain (Fourth Area  of Pain) (L>R) 02/14/2017   Chronic neck pain 02/14/2017   Chronic upper extremity pain 02/14/2017   Chronic sacroiliac joint pain 02/14/2017   Adenomatous polyp of colon    CTS (carpal tunnel syndrome) 08/28/2016   Hepatitis B surface antigen positive 11/19/2015   Enthesitis 11/19/2015   Facet joint disease of lumbosacral region ([place]) 11/19/2015   Chronic bilateral low back pain with left-sided sciatica 11/09/2015   Psoriasis 11/09/2015   Psoriatic arthritis ([place]) 11/09/2015   Tendinitis of right foot 11/09/2015    Allergies  Allergen Reactions   Contrast Media [Iodinated Contrast Media] Shortness Of Breath   Bee Venom Swelling   Codeine Nausea And Vomiting        Shellfish Allergy Nausea And Vomiting, Swelling and Rash        Sodium Clavulanate Diarrhea    Past Surgical History:  Procedure Laterality Date   COLONOSCOPY     COLONOSCOPY WITH PROPOFOL N/A 10/09/2016   Procedure: COLONOSCOPY WITH PROPOFOL;  Surgeon: [name], MD;  Location: [place];  Service: Endoscopy;  Laterality: N/A;  Diabetic - oral meds   DILATION AND CURETTAGE OF UTERUS     ESOPHAGOGASTRODUODENOSCOPY (EGD) WITH PROPOFOL N/A 01/03/2018   Procedure: ESOPHAGOGASTRODUODENOSCOPY (EGD) WITH PROPOFOL;  Surgeon: [name], MD;  Location: [facility];  Service: Endoscopy;  Laterality: N/A;  Diabetic - oral meds   INDUCED ABORTION     POLYPECTOMY  10/09/2016   Procedure: POLYPECTOMY;  Surgeon: [name], MD;  Location: [facility];  Service: Endoscopy;;   TUBAL LIGATION      Social History   Tobacco Use   Smoking status: Former    Packs/day: 1.00    Years: 18.00    Pack years: 18.00    Types: Cigarettes    Quit date: 06/12/2006    Years since quitting: 15.1   Smokeless tobacco: Never  Vaping Use   Vaping Use: Never used  Substance Use Topics   Alcohol use: No   Drug use: No     Medication list has been reviewed and updated.  No outpatient medications have been  marked as taking for the 07/26/21 encounter (Office Visit) with Glean Hess, MD.    [facility] 2/9 Scores 03/24/2021 11/19/2020 11/10/2020 08/06/2020  PHQ - 2 Score 0 0 0 4  PHQ- 9 Score 3 0 - 6    GAD 7 : Generalized Anxiety Score 03/24/2021 11/19/2020 08/06/2020 03/10/2020  Nervous, Anxious, on Edge 0 0 0 0  Control/stop worrying 0 0 0 0  Worry too much - different things 0 0 0 0  Trouble relaxing 0 0 0 0  Restless 0 0 0 0  Easily annoyed or irritable 0 0 0 0  Afraid - awful might happen 0 0 0 0  Total GAD 7 Score 0 0 0 0  Anxiety Difficulty - - - Not difficult at all    BP Readings from Last 3 Encounters:  06/08/21 134/73  03/24/21 104/76  11/19/20 112/78    Physical Exam  Wt Readings from Last 3 Encounters:  06/08/21 247 lb 2.2 oz (112.1 kg)  03/24/21 247 lb 3.2 oz (112.1 kg)  11/19/20 254 lb (115.2 kg)    Ht _0  (1.702 m)    BMI 38.71 kg/m   Assessment and Plan:

## 2021-07-26 NOTE — Telephone Encounter (Signed)
Patient came in for her appt today for DM, HTN follow up. Dr Army Melia reviewed her chart and patient has a physical scheduled 2 weeks from today.  Per Dr Army Melia gave patient the option to just come back for an appointment at her scheduled physical or we can keep both appointments.  She decided to cancel todays appt. And We will see her in 2 weeks at her physical.

## 2021-08-01 DIAGNOSIS — Z79899 Other long term (current) drug therapy: Secondary | ICD-10-CM | POA: Diagnosis not present

## 2021-08-01 DIAGNOSIS — L4050 Arthropathic psoriasis, unspecified: Secondary | ICD-10-CM | POA: Diagnosis not present

## 2021-08-01 DIAGNOSIS — Z111 Encounter for screening for respiratory tuberculosis: Secondary | ICD-10-CM | POA: Diagnosis not present

## 2021-08-01 DIAGNOSIS — L409 Psoriasis, unspecified: Secondary | ICD-10-CM | POA: Diagnosis not present

## 2021-08-05 ENCOUNTER — Ambulatory Visit: Payer: Self-pay | Admitting: *Deleted

## 2021-08-05 NOTE — Telephone Encounter (Signed)
Summary: personal matter   Pt states she would like a call back from the nurse.  When I asked what this would be about, she said it was personal.      Chief Complaint: positive TB Symptoms: none Frequency: na Pertinent Negatives: Patient denies symptoms Disposition: [] ED /[] Urgent Care (no appt availability in office) / [] Appointment(In office/virtual)/ []  [facility]/ [] Home Care/ [] Refused Recommended Disposition /[] [place] [facility]/ [x]  Follow-up with PCP Additional Notes: Pt has appt 228/23 and just wanted to let Dr. Army Melia know she tested positive for TB, as upcoming CXR sheduled. Concerned if can still come into office, explained it is problable latent but I  will route to Dr. Army Melia. Reassured her she could keep that appt and do a virtual visit if Dr. Army Melia prefers.  Reason for Disposition  Tuberculosis, questions about  Answer Assessment - Initial Assessment Questions 1. PLACE of EXPOSURE: "Where were you when you were exposed to Tuberculosis?" (e.g., city, state, country; school, work, hospital)     unknown 2. TYPE of EXPOSURE: "How were you exposed?" (e.g., close contact)     unknown 3. DATE of EXPOSURE: "When did the exposure occur?" (e.g., days)     unknown 4. PREGNANCY: "Is there any chance you are pregnant?" "When was your last menstrual period?"     no 5. HIGH RISK for COMPLICATIONS: "Do you have a weakened immune system?" (e.g., HIV positive, cancer chemotherapy)     no 6. TB SKIN TEST: "When was your last TB Skin Test?" (e.g., date, never had one, uncertain)  "What was the result?" (e.g., positive, negative;  width in millimeters; caller unsure)     Positive lab test, has CXR scheduled.  Protocols used: Tuberculosis Exposure-A-AH

## 2021-08-08 NOTE — Telephone Encounter (Signed)
Patient okay to come in tomorrow - no symptoms. No cough, no fever. Chest XR is scheduled for tomorrow after her appt with Dr B.

## 2021-08-09 ENCOUNTER — Encounter: Payer: Self-pay | Admitting: Internal Medicine

## 2021-08-09 ENCOUNTER — Other Ambulatory Visit: Payer: Self-pay

## 2021-08-09 ENCOUNTER — Ambulatory Visit (INDEPENDENT_AMBULATORY_CARE_PROVIDER_SITE_OTHER): Payer: Medicare HMO | Admitting: Internal Medicine

## 2021-08-09 VITALS — BP 122/80 | HR 77 | Ht 67.0 in | Wt 246.0 lb

## 2021-08-09 DIAGNOSIS — L409 Psoriasis, unspecified: Secondary | ICD-10-CM | POA: Diagnosis not present

## 2021-08-09 DIAGNOSIS — F39 Unspecified mood [affective] disorder: Secondary | ICD-10-CM

## 2021-08-09 DIAGNOSIS — E785 Hyperlipidemia, unspecified: Secondary | ICD-10-CM

## 2021-08-09 DIAGNOSIS — E118 Type 2 diabetes mellitus with unspecified complications: Secondary | ICD-10-CM

## 2021-08-09 DIAGNOSIS — H8113 Benign paroxysmal vertigo, bilateral: Secondary | ICD-10-CM

## 2021-08-09 DIAGNOSIS — Z1211 Encounter for screening for malignant neoplasm of colon: Secondary | ICD-10-CM | POA: Diagnosis not present

## 2021-08-09 DIAGNOSIS — Z23 Encounter for immunization: Secondary | ICD-10-CM | POA: Diagnosis not present

## 2021-08-09 DIAGNOSIS — R7611 Nonspecific reaction to tuberculin skin test without active tuberculosis: Secondary | ICD-10-CM | POA: Diagnosis not present

## 2021-08-09 DIAGNOSIS — E1169 Type 2 diabetes mellitus with other specified complication: Secondary | ICD-10-CM | POA: Diagnosis not present

## 2021-08-09 DIAGNOSIS — Z6841 Body Mass Index (BMI) 40.0 and over, adult: Secondary | ICD-10-CM | POA: Diagnosis not present

## 2021-08-09 DIAGNOSIS — L4050 Arthropathic psoriasis, unspecified: Secondary | ICD-10-CM

## 2021-08-09 DIAGNOSIS — G2581 Restless legs syndrome: Secondary | ICD-10-CM

## 2021-08-09 DIAGNOSIS — Z0000 Encounter for general adult medical examination without abnormal findings: Secondary | ICD-10-CM

## 2021-08-09 DIAGNOSIS — I10 Essential (primary) hypertension: Secondary | ICD-10-CM | POA: Diagnosis not present

## 2021-08-09 DIAGNOSIS — R7612 Nonspecific reaction to cell mediated immunity measurement of gamma interferon antigen response without active tuberculosis: Secondary | ICD-10-CM | POA: Diagnosis not present

## 2021-08-09 DIAGNOSIS — R69 Illness, unspecified: Secondary | ICD-10-CM | POA: Diagnosis not present

## 2021-08-09 DIAGNOSIS — Z1231 Encounter for screening mammogram for malignant neoplasm of breast: Secondary | ICD-10-CM

## 2021-08-09 MED ORDER — VENLAFAXINE HCL ER 37.5 MG PO CP24: 37.5000 mg | ORAL_CAPSULE | Freq: Every day | ORAL | 1 refills | Status: DC

## 2021-08-09 MED ORDER — GABAPENTIN 300 MG PO CAPS: 300.0000 mg | ORAL_CAPSULE | Freq: Every day | ORAL | 1 refills | Status: DC

## 2021-08-09 NOTE — Progress Notes (Signed)
Date:  08/09/2021   Name:  Diane Morris   DOB:  05/09/1951   MRN:  [id number]   Chief Complaint: Annual Exam Diane Morris is a 71 y.o. female who presents today for her Complete Annual Exam. She feels fairly well. She reports exercising - none. She reports she is sleeping well. Breast complaints - none.  Mammogram: 08/2020 DEXA: 08/2020 osteopenia Pap smear: discontinued Colonoscopy: 09/2016 due for repeat Dr. Allen Norris  Immunization History  Administered Date(s) Administered   Fluad Quad(high Dose 65+) 03/10/2020, 03/24/2021   Moderna Sars-Covid-2 Vaccination 09/24/2019, 10/22/2019, 04/26/2020   Pneumococcal Conjugate-13 08/06/2020    Hypertension This is a chronic problem. The problem is controlled. Pertinent negatives include no chest pain, headaches, palpitations or shortness of breath. Past treatments include ACE inhibitors, diuretics and beta blockers. The current treatment provides significant improvement. There is no history of kidney disease, CAD/MI or CVA.  Diabetes She presents for her follow-up diabetic visit. She has type 2 diabetes mellitus. Hypoglycemia symptoms include dizziness. Pertinent negatives for hypoglycemia include no headaches, nervousness/anxiousness or tremors. Pertinent negatives for diabetes include no chest pain, no fatigue, no polydipsia and no polyuria. Pertinent negatives for diabetic complications include no CVA. Current diabetic treatment includes oral agent (monotherapy) (farxiga). An ACE inhibitor/angiotensin II receptor blocker is being taken.  Hyperlipidemia This is a chronic problem. The problem is uncontrolled. Associated symptoms include myalgias. Pertinent negatives include no chest pain or shortness of breath. Current antihyperlipidemic treatment includes statins. The current treatment provides significant improvement of lipids.   Lab Results  Component Value Date   NA 135 11/19/2020   K 5.0 11/19/2020   CO2 21 11/19/2020   GLUCOSE 110 (H)  11/19/2020   BUN 16 11/19/2020   CREATININE 0.84 11/19/2020   CALCIUM 10.5 (H) 11/19/2020   EGFR 76 11/19/2020   GFRNONAA 92 08/06/2020   Lab Results  Component Value Date   CHOL 197 11/19/2020   HDL 35 (L) 11/19/2020   LDLCALC 111 (H) 11/19/2020   TRIG 294 (H) 11/19/2020   CHOLHDL 5.6 (H) 11/19/2020   Lab Results  Component Value Date   TSH 2.110 08/06/2019   Lab Results  Component Value Date   HGBA1C 6.2 (A) 03/24/2021   Lab Results  Component Value Date   WBC 11.6 (H) 08/06/2020   HGB 15.1 08/06/2020   HCT 45.3 08/06/2020   MCV 85 08/06/2020   PLT 350 08/06/2020   Lab Results  Component Value Date   ALT 24 11/19/2020   AST 22 11/19/2020   ALKPHOS 153 (H) 11/19/2020   BILITOT 0.4 11/19/2020   Lab Results  Component Value Date   25OHVITD2 <1.0 02/14/2017   25OHVITD3 11 02/14/2017     Review of Systems  Constitutional:  Negative for chills, fatigue and fever.  HENT:  Negative for congestion, hearing loss, tinnitus, trouble swallowing and voice change.   Eyes:  Negative for visual disturbance.  Respiratory:  Negative for cough, chest tightness, shortness of breath and wheezing.   Cardiovascular:  Negative for chest pain, palpitations and leg swelling.  Gastrointestinal:  Negative for abdominal pain, constipation, diarrhea and vomiting.  Endocrine: Negative for polydipsia and polyuria.  Genitourinary:  Negative for dysuria, frequency, genital sores, vaginal bleeding and vaginal discharge.  Musculoskeletal:  Positive for arthralgias and myalgias. Negative for gait problem and joint swelling.  Skin:  Positive for rash. Negative for color change.  Neurological:  Positive for dizziness. Negative for tremors, light-headedness and headaches.  Hematological:  Negative for  adenopathy. Does not bruise/bleed easily.  Psychiatric/Behavioral:  Negative for dysphoric mood and sleep disturbance. The patient is not nervous/anxious.    Patient Active Problem List   Diagnosis  Date Noted   Mood disorder ([place]) 12/08/2019   Hepatic steatosis 12/07/2019   Elevated alkaline phosphatase level 08/08/2019   Hyperlipidemia associated with type 2 diabetes mellitus ([place]) 08/06/2019   Essential hypertension 01/31/2019   GERD without esophagitis 01/31/2019   Type II diabetes mellitus with complication ([place]) 31/05/1623   Morbid obesity ([place]) 01/31/2019   Benign paroxysmal positional vertigo due to bilateral vestibular disorder 01/31/2019   History of gastritis    Vitamin D deficiency 02/19/2017   Chronic low back pain (Primary Area of Pain) (L>R) 02/14/2017   Chronic hip pain (Secondary Area of Pain) (L>R) 02/14/2017   Chronic right shoulder pain (Tertiary Area of Pain) 02/14/2017   Bilateral chronic knee pain (Fourth Area of Pain) (L>R) 02/14/2017   Chronic neck pain 02/14/2017   Chronic upper extremity pain 02/14/2017   Chronic sacroiliac joint pain 02/14/2017   Adenomatous polyp of colon    CTS (carpal tunnel syndrome) 08/28/2016   Hepatitis B surface antigen positive 11/19/2015   Enthesitis 11/19/2015   Facet joint disease of lumbosacral region ([place]) 11/19/2015   Chronic bilateral low back pain with left-sided sciatica 11/09/2015   Psoriasis 11/09/2015   Psoriatic arthritis ([place]) 11/09/2015   Tendinitis of right foot 11/09/2015    Allergies  Allergen Reactions   Contrast Media [Iodinated Contrast Media] Shortness Of Breath   Bee Venom Swelling   Codeine Nausea And Vomiting        Shellfish Allergy Nausea And Vomiting, Swelling and Rash        Sodium Clavulanate Diarrhea    Past Surgical History:  Procedure Laterality Date   COLONOSCOPY     COLONOSCOPY WITH PROPOFOL N/A 10/09/2016   Procedure: COLONOSCOPY WITH PROPOFOL;  Surgeon: [name], MD;  Location: [place];  Service: Endoscopy;  Laterality: N/A;  Diabetic - oral meds   DILATION AND CURETTAGE OF UTERUS     ESOPHAGOGASTRODUODENOSCOPY (EGD) WITH PROPOFOL N/A 01/03/2018   Procedure:  ESOPHAGOGASTRODUODENOSCOPY (EGD) WITH PROPOFOL;  Surgeon: [name], MD;  Location: [place];  Service: Endoscopy;  Laterality: N/A;  Diabetic - oral meds   INDUCED ABORTION     POLYPECTOMY  10/09/2016   Procedure: POLYPECTOMY;  Surgeon: [name], MD;  Location: [place];  Service: Endoscopy;;   TUBAL LIGATION      Social History   Tobacco Use   Smoking status: Former    Packs/day: 1.00    Years: 18.00    Pack years: 18.00    Types: Cigarettes    Quit date: 06/12/2006    Years since quitting: 15.1   Smokeless tobacco: Never  Vaping Use   Vaping Use: Never used  Substance Use Topics   Alcohol use: No   Drug use: No     Medication list has been reviewed and updated.  Current Meds  Medication Sig   atorvastatin (LIPITOR) 80 MG tablet Take 1 tablet (80 mg total) by mouth daily.   Cetirizine HCl (ALLERGY, CETIRIZINE, PO) Take by mouth.   cyclobenzaprine (FLEXERIL) 10 MG tablet TAKE 1 TABLET (10 MG TOTAL) BY MOUTH AT BEDTIME. RESTLESS LEG- USUALLY AT BEDTIME.   gabapentin (NEURONTIN) 300 MG capsule Take 300 mg by mouth daily. Sciatic Nerve Pain- taking only at bedtime.   Ibuprofen 200 MG CAPS Take by mouth.   lisinopril-hydrochlorothiazide (ZESTORETIC) 10-12.5  MG tablet TAKE 1 TABLET BY MOUTH EVERY DAY   meclizine (ANTIVERT) 25 MG tablet Take 12.5 mg by mouth daily. OTC daily.   metoprolol succinate (TOPROL-XL) 100 MG 24 hr tablet TAKE 1 TABLET (100 MG TOTAL) BY MOUTH EVERY EVENING. TAKE WITH OR IMMEDIATELY FOLLOWING A MEAL.   omeprazole (PRILOSEC) 20 MG capsule Take 20 mg by mouth daily. OTC   venlafaxine XR (EFFEXOR-XR) 37.5 MG 24 hr capsule TAKE 1 CAPSULE BY MOUTH DAILY WITH BREAKFAST.   VENTOLIN HFA 108 (90 Base) MCG/ACT inhaler TAKE 2 PUFFS BY MOUTH EVERY 6 HOURS AS NEEDED FOR WHEEZE OR SHORTNESS OF BREATH    PHQ 2/9 Scores 08/09/2021 03/24/2021 11/19/2020 11/10/2020  PHQ - 2 Score 0 0 0 0  PHQ- 9 Score 0 3 0 -    GAD 7 : Generalized Anxiety Score  08/09/2021 03/24/2021 11/19/2020 08/06/2020  Nervous, Anxious, on Edge 0 0 0 0  Control/stop worrying 0 0 0 0  Worry too much - different things 0 0 0 0  Trouble relaxing 0 0 0 0  Restless 0 0 0 0  Easily annoyed or irritable 0 0 0 0  Afraid - awful might happen 0 0 0 0  Total GAD 7 Score 0 0 0 0  Anxiety Difficulty Not difficult at all - - -    BP Readings from Last 3 Encounters:  08/09/21 122/80  06/08/21 134/73  03/24/21 104/76    Physical Exam Vitals and nursing note reviewed.  Constitutional:      General: She is not in acute distress.    Appearance: She is well-developed. She is obese.  HENT:     Head: Normocephalic and atraumatic.     Right Ear: Tympanic membrane and ear canal normal.     Left Ear: Tympanic membrane and ear canal normal.     Nose:     Right Sinus: No maxillary sinus tenderness.     Left Sinus: No maxillary sinus tenderness.  Eyes:     General: No scleral icterus.       Right eye: No discharge.        Left eye: No discharge.     Conjunctiva/sclera: Conjunctivae normal.  Neck:     Thyroid: No thyromegaly.     Vascular: No carotid bruit.  Cardiovascular:     Rate and Rhythm: Normal rate and regular rhythm.     Pulses: Normal pulses.     Heart sounds: Normal heart sounds.  Pulmonary:     Effort: Pulmonary effort is normal. No respiratory distress.     Breath sounds: No wheezing.  Chest:  Breasts:    Right: No mass, nipple discharge, skin change or tenderness.     Left: No mass, nipple discharge, skin change or tenderness.  Abdominal:     General: Bowel sounds are normal.     Palpations: Abdomen is soft.     Tenderness: There is no abdominal tenderness.  Musculoskeletal:     Cervical back: Normal range of motion. No erythema.     Right lower leg: No edema.     Left lower leg: No edema.  Lymphadenopathy:     Cervical: No cervical adenopathy.  Skin:    General: Skin is warm and dry.     Capillary Refill: Capillary refill takes less than 2  seconds.     Findings: Rash (psoriatic rash on both breasts, elbows, knees, abdomen) present.  Neurological:     General: No focal deficit present.  Mental Status: She is alert and oriented to person, place, and time.     Cranial Nerves: No cranial nerve deficit.     Sensory: No sensory deficit.     Deep Tendon Reflexes: Reflexes are normal and symmetric.  Psychiatric:        Attention and Perception: Attention normal.        Mood and Affect: Mood normal.    Wt Readings from Last 3 Encounters:  08/09/21 246 lb (111.6 kg)  06/08/21 247 lb 2.2 oz (112.1 kg)  03/24/21 247 lb 3.2 oz (112.1 kg)    BP 122/80    Pulse 77    Ht _0  (1.702 m)    Wt 246 lb (111.6 kg)    SpO2 97%    BMI 38.53 kg/m   Assessment and Plan: 1. Annual physical exam Exam is normal except for weight. Encourage regular exercise and appropriate dietary changes. Recommend Calcium and vitamin D daily. [id number] today.  2. Encounter for screening mammogram for breast cancer - MM 3D SCREEN BREAST BILATERAL  3. Colon cancer screening Due for 5 yr colon - Ambulatory referral to Gastroenterology  4. Essential hypertension Clinically stable exam with well controlled BP. Tolerating medications without side effects at this time. Pt to continue current regimen and low sodium diet; benefits of regular exercise as able discussed. - CBC with Differential/Platelet - TSH - POCT urinalysis dipstick  5. Type II diabetes mellitus with complication (HCC) Clinically stable by exam and report without s/s of hypoglycemia. DM complicated by hypertension and dyslipidemia. On diet alone for now - Farxiga discontinued - Comprehensive metabolic panel - Hemoglobin A1c  6. Hyperlipidemia associated with type 2 diabetes mellitus ([place]) Tolerating statin medication without side effects at this time LDL is at goal of < 70 on current dose. Triglycerides are elevated - resume Tricor Continue atorvastatin - Lipid panel  7.  Psoriatic arthritis ([place]) Resuming Otezla  8. Psoriasis Resuming Otezla  9. Benign paroxysmal positional vertigo due to bilateral vestibular disorder - venlafaxine XR (EFFEXOR-XR) 37.5 MG 24 hr capsule; Take 1 capsule (37.5 mg total) by mouth daily with breakfast.  Dispense: 90 capsule; Refill: 1  10. Restless leg syndrome Taking gabapentin nightly - has just depleted her large supply from previous PCP - gabapentin (NEURONTIN) 300 MG capsule; Take 1 capsule (300 mg total) by mouth daily. Sciatic Nerve Pain- taking only at bedtime.  Dispense: 90 capsule; Refill: 1  11. Mood disorder ([place]) Doing well - on Effexor for vertigo.  12. BMI 40.0-44.9, adult ([place]) Continue to work on weight loss.   Partially dictated using Editor, commissioning. Any errors are unintentional.  Halina Maidens, MD [facility]  08/09/2021

## 2021-08-09 NOTE — Patient Instructions (Addendum)
Recommend calcium 1200 mg per day and Vitamin D 1000 IU daily plus weight bearing exercise.  Will repeat DEXA in 2-3 years.  Put the [place] aside.  Restart Fenofibrate.

## 2021-08-10 ENCOUNTER — Other Ambulatory Visit: Payer: Self-pay | Admitting: Internal Medicine

## 2021-08-10 DIAGNOSIS — E118 Type 2 diabetes mellitus with unspecified complications: Secondary | ICD-10-CM

## 2021-08-10 DIAGNOSIS — G2581 Restless legs syndrome: Secondary | ICD-10-CM

## 2021-08-10 LAB — COMPREHENSIVE METABOLIC PANEL
ALT: 26 IU/L (ref 0–32)
AST: 20 IU/L (ref 0–40)
Albumin/Globulin Ratio: 2 (ref 1.2–2.2)
Albumin: 4.8 g/dL (ref 3.8–4.8)
Alkaline Phosphatase: 181 IU/L — ABNORMAL HIGH (ref 44–121)
BUN/Creatinine Ratio: 20 (ref 12–28)
BUN: 13 mg/dL (ref 8–27)
Bilirubin Total: 0.5 mg/dL (ref 0.0–1.2)
CO2: 24 mmol/L (ref 20–29)
Calcium: 10.2 mg/dL (ref 8.7–10.3)
Chloride: 99 mmol/L (ref 96–106)
Creatinine, Ser: 0.65 mg/dL (ref 0.57–1.00)
Globulin, Total: 2.4 g/dL (ref 1.5–4.5)
Glucose: 124 mg/dL — ABNORMAL HIGH (ref 70–99)
Potassium: 5.1 mmol/L (ref 3.5–5.2)
Sodium: 138 mmol/L (ref 134–144)
Total Protein: 7.2 g/dL (ref 6.0–8.5)
eGFR: 96 mL/min/{1.73_m2} (ref >59)

## 2021-08-10 LAB — CBC WITH DIFFERENTIAL/PLATELET
Basophils Absolute: 0.1 10*3/uL (ref 0.0–0.2)
Basos: 1 %
EOS (ABSOLUTE): 0.4 10*3/uL (ref 0.0–0.4)
Eos: 3 %
Hematocrit: 47.7 % — ABNORMAL HIGH (ref 34.0–46.6)
Hemoglobin: 15.8 g/dL (ref 11.1–15.9)
Immature Grans (Abs): 0.1 10*3/uL (ref 0.0–0.1)
Immature Granulocytes: 1 %
Lymphocytes Absolute: 3.3 10*3/uL — ABNORMAL HIGH (ref 0.7–3.1)
Lymphs: 30 %
MCH: 28.6 pg (ref 26.6–33.0)
MCHC: 33.1 g/dL (ref 31.5–35.7)
MCV: 86 fL (ref 79–97)
Monocytes Absolute: 0.6 10*3/uL (ref 0.1–0.9)
Monocytes: 6 %
Neutrophils Absolute: 6.5 10*3/uL (ref 1.4–7.0)
Neutrophils: 59 %
Platelets: 342 10*3/uL (ref 150–450)
RBC: 5.52 x10E6/uL — ABNORMAL HIGH (ref 3.77–5.28)
RDW: 13.4 % (ref 11.7–15.4)
WBC: 11 10*3/uL — ABNORMAL HIGH (ref 3.4–10.8)

## 2021-08-10 LAB — HEMOGLOBIN A1C
Est. average glucose Bld gHb Est-mCnc: 143 mg/dL
Hgb A1c MFr Bld: 6.6 % — ABNORMAL HIGH (ref 4.8–5.6)

## 2021-08-10 LAB — LIPID PANEL
Chol/HDL Ratio: 5.2 ratio — ABNORMAL HIGH (ref 0.0–4.4)
Cholesterol, Total: 198 mg/dL (ref 100–199)
HDL: 38 mg/dL — ABNORMAL LOW (ref >39)
LDL Chol Calc (NIH): 94 mg/dL (ref 0–99)
Triglycerides: 400 mg/dL — ABNORMAL HIGH (ref 0–149)
VLDL Cholesterol Cal: 66 mg/dL — ABNORMAL HIGH (ref 5–40)

## 2021-08-10 LAB — TSH: TSH: 2.3 u[IU]/mL (ref 0.450–4.500)

## 2021-08-10 MED ORDER — DAPAGLIFLOZIN PROPANEDIOL 10 MG PO TABS: 10.0000 mg | ORAL_TABLET | Freq: Every day | ORAL | 0 refills | Status: DC

## 2021-08-10 NOTE — Telephone Encounter (Signed)
Requested medications are due for refill today.  yes ? ?Requested medications are on the active medications list.  yes ? ?Last refill. 04/11/2021 #30 3 refills ? ?Future visit scheduled.   yes ? ?Notes to clinic.  Medication not delegated. ? ? ? ?Requested Prescriptions  ?Pending Prescriptions Disp Refills  ? cyclobenzaprine (FLEXERIL) 10 MG tablet [Pharmacy Med Name: CYCLOBENZAPRINE 10 MG TABLET] 30 tablet 3  ?  Sig: TAKE 1 TABLET (10 MG TOTAL) BY MOUTH AT BEDTIME. RESTLESS LEG- USUALLY AT BEDTIME.  ?  ? Not Delegated - Analgesics:  Muscle Relaxants Failed - 08/10/2021  1:50 AM  ?  ?  Failed - This refill cannot be delegated  ?  ?  Passed - Valid encounter within last 6 months  ?  Recent Outpatient Visits   ? ?      ? Yesterday Annual physical exam  ? [facility] Glean Hess, MD  ? 4 months ago Type II diabetes mellitus with complication [facility])  ? [facility] Glean Hess, MD  ? 8 months ago Type II diabetes mellitus with complication [facility])  ? [facility] Glean Hess, MD  ? 1 year ago Type II diabetes mellitus with complication [facility])  ? [facility] Glean Hess, MD  ? 1 year ago Type II diabetes mellitus with complication [facility])  ? [facility] Glean Hess, MD  ? ?  ?  ?Future Appointments   ? ?        ? In 3 months Army Melia Jesse Sans, MD [facility], [place]  ? In 12 months Glean Hess, MD [facility], [place], [place]  ? ?  ? ?  ?  ?  ?  ?

## 2021-08-10 NOTE — Progress Notes (Signed)
Informed pt with results pt verbalized understanding. Pt wants to know if she should start back taking farxiga.  KP

## 2021-08-11 ENCOUNTER — Ambulatory Visit (LOCAL_COMMUNITY_HEALTH_CENTER): Payer: Self-pay

## 2021-08-11 VITALS — Ht 67.0 in | Wt 247.0 lb

## 2021-08-11 DIAGNOSIS — R7612 Nonspecific reaction to cell mediated immunity measurement of gamma interferon antigen response without active tuberculosis: Secondary | ICD-10-CM

## 2021-08-12 ENCOUNTER — Other Ambulatory Visit: Payer: Self-pay

## 2021-08-18 ENCOUNTER — Telehealth: Payer: Self-pay

## 2021-08-18 NOTE — Telephone Encounter (Signed)
TC to [facility]. Message left for Diane Morris, Dr. Posey Pronto RN- informed patient is unsure if she will proceed with LTBI tx. Patient may request a repeat QFT. Aileen Fass, RN  ?

## 2021-08-18 NOTE — Progress Notes (Signed)
EPI completed via phone. Discussed +QFT results with the patient.  Co LTBI vs Active and typical LTBI tx regimens.  Patient is unsure if results are accurate and unsure if she will proceed with LTBI tx. Patient may request a repeat QFT with her provider, Dr. Posey Pronto at [facility]. TB info and medication information mailed to patient.  Patient verbalized will f/u with TB RN if interested in LTBI tx. Aileen Fass, RN  ? ?Dr. Posey Pronto [phone] fax [phone] ?

## 2021-08-22 ENCOUNTER — Telehealth: Payer: Self-pay

## 2021-08-24 NOTE — Telephone Encounter (Signed)
TC from patient.  States she received TB info in the mail and wants to discuss next steps.  Patient says she wants to have another QFT completed.  She has always had negative TB skin tests in the past.  If the repeat QFT is positive, she would like to proceed with LTBI tx.  TB RN has patient on reminder list to look for f/u results. Aileen Fass, RN  ?

## 2021-08-30 DIAGNOSIS — R7612 Nonspecific reaction to cell mediated immunity measurement of gamma interferon antigen response without active tuberculosis: Secondary | ICD-10-CM | POA: Diagnosis not present

## 2021-09-13 ENCOUNTER — Telehealth: Payer: Self-pay

## 2021-09-13 NOTE — Telephone Encounter (Signed)
Called pt left VM as a reminder to call and schedule mammogram. [phone] ° °KP °

## 2021-09-19 NOTE — Telephone Encounter (Signed)
Patient's repeat QFT was negative on 08/30/21.  Closed to TB f/u Aileen Fass, RN  ?

## 2021-10-09 ENCOUNTER — Other Ambulatory Visit: Payer: Self-pay | Admitting: Internal Medicine

## 2021-10-09 DIAGNOSIS — I10 Essential (primary) hypertension: Secondary | ICD-10-CM

## 2021-10-10 NOTE — Telephone Encounter (Signed)
Requested Prescriptions  ?Pending Prescriptions Disp Refills  ?? metoprolol succinate (TOPROL-XL) 100 MG 24 hr tablet [Pharmacy Med Name: METOPROLOL SUCC ER 100 MG TAB] 90 tablet 0  ?  Sig: TAKE 1 TABLET (100 MG TOTAL) BY MOUTH EVERY EVENING. TAKE WITH OR IMMEDIATELY FOLLOWING A MEAL.  ?  ? Cardiovascular:  Beta Blockers Passed - 10/09/2021  8:30 AM  ?  ?  Passed - Last BP in normal range  ?  BP Readings from Last 1 Encounters:  ?08/09/21 122/80  ?   ?  ?  Passed - Last Heart Rate in normal range  ?  Pulse Readings from Last 1 Encounters:  ?08/09/21 77  ?   ?  ?  Passed - Valid encounter within last 6 months  ?  Recent Outpatient Visits   ?      ? 2 months ago Annual physical exam  ? [facility] Glean [facility], [facility]  ? 6 months ago Type II diabetes mellitus with complication [facility])  ? [facility] Glean Hess, MD  ? 10 months ago Type II diabetes mellitus with complication [facility])  ? [facility] Glean Hess, MD  ? 1 year ago Type II diabetes mellitus with complication [facility])  ? [facility] Glean Hess, MD  ? 1 year ago Type II diabetes mellitus with complication [facility])  ? [facility] Glean Hess, MD  ?  ?  ?Future Appointments   ?        ? In 1 month Army Melia Jesse Sans, MD [facility], [place]  ? In 10 months Glean Hess, MD [facility], [place]  ?  ? ?  ?  ?  ? ?

## 2021-10-13 ENCOUNTER — Other Ambulatory Visit: Payer: Self-pay | Admitting: Internal Medicine

## 2021-10-13 DIAGNOSIS — I10 Essential (primary) hypertension: Secondary | ICD-10-CM

## 2021-10-13 NOTE — Telephone Encounter (Signed)
Requested Prescriptions  ?Pending Prescriptions Disp Refills  ?? lisinopril-hydrochlorothiazide (ZESTORETIC) 10-12.5 MG tablet [Pharmacy Med Name: LISINOPRIL-HCTZ 10-12.5 MG TAB] 90 tablet 0  ?  Sig: TAKE 1 TABLET BY MOUTH EVERY DAY  ?  ? Cardiovascular:  ACEI + Diuretic Combos Passed - 10/13/2021  3:04 AM  ?  ?  Passed - Na in normal range and within 180 days  ?  Sodium  ?Date Value Ref Range Status  ?08/09/2021 138 134 - 144 mmol/L Final  ?   ?  ?  Passed - K in normal range and within 180 days  ?  Potassium  ?Date Value Ref Range Status  ?08/09/2021 5.1 3.5 - 5.2 mmol/L Final  ?   ?  ?  Passed - Cr in normal range and within 180 days  ?  Creatinine, Ser  ?Date Value Ref Range Status  ?08/09/2021 0.65 0.57 - 1.00 mg/dL Final  ?   ?  ?  Passed - eGFR is 30 or above and within 180 days  ?  GFR calc Af Amer  ?Date Value Ref Range Status  ?08/06/2020 106 >59 mL/min/1.73 Final  ?  Comment:  ?  **In accordance with recommendations from the NKF-ASN Task force,** ?  [facility] is in the process of updating its eGFR calculation to the ?  2021 CKD-EPI creatinine equation that estimates kidney function ?  without a race variable. ?  ? ?GFR calc non Af Amer  ?Date Value Ref Range Status  ?08/06/2020 92 >59 mL/min/1.73 Final  ? ?eGFR  ?Date Value Ref Range Status  ?08/09/2021 96 >59 mL/min/1.73 Final  ?   ?  ?  Passed - Patient is not pregnant  ?  ?  Passed - Last BP in normal range  ?  BP Readings from Last 1 Encounters:  ?08/09/21 122/80  ?   ?  ?  Passed - Valid encounter within last 6 months  ?  Recent Outpatient Visits   ?      ? 2 months ago Annual physical exam  ? [facility], MD  ? 6 months ago Type II diabetes mellitus with complication [facility])  ? [facility] Glean Hess, MD  ? 10 months ago Type II diabetes mellitus with complication [facility])  ? [facility] Glean Hess, MD  ? 1 year ago Type II diabetes mellitus with complication [facility])  ? [facility]  Glean Hess, MD  ? 1 year ago Type II diabetes mellitus with complication [facility])  ? [facility] Glean Hess, MD  ?  ?  ?Future Appointments   ?        ? In 1 month Army Melia Jesse Sans, MD [facility], [place]  ? In 10 months Glean Hess, MD [facility]  ?  ? ?  ?  ?  ? ?

## 2021-11-12 ENCOUNTER — Other Ambulatory Visit: Payer: Self-pay | Admitting: Internal Medicine

## 2021-11-12 DIAGNOSIS — E1169 Type 2 diabetes mellitus with other specified complication: Secondary | ICD-10-CM

## 2021-11-14 ENCOUNTER — Ambulatory Visit (INDEPENDENT_AMBULATORY_CARE_PROVIDER_SITE_OTHER): Payer: Medicare HMO

## 2021-11-14 ENCOUNTER — Other Ambulatory Visit: Payer: Self-pay | Admitting: Internal Medicine

## 2021-11-14 DIAGNOSIS — Z0000 Encounter for general adult medical examination without abnormal findings: Secondary | ICD-10-CM | POA: Diagnosis not present

## 2021-11-14 DIAGNOSIS — E1169 Type 2 diabetes mellitus with other specified complication: Secondary | ICD-10-CM

## 2021-11-14 MED ORDER — FENOFIBRATE 145 MG PO TABS: 145.0000 mg | ORAL_TABLET | Freq: Every day | ORAL | 0 refills | Status: DC

## 2021-11-14 NOTE — Telephone Encounter (Signed)
Requested Prescriptions  Pending Prescriptions Disp Refills  . atorvastatin (LIPITOR) 80 MG tablet [Pharmacy Med Name: ATORVASTATIN 80 MG TABLET] 90 tablet 2    Sig: TAKE 1 TABLET BY MOUTH EVERY DAY     Cardiovascular:  Antilipid - Statins Failed - 11/12/2021  9:15 AM      Failed - Lipid Panel in normal range within the last 12 months    Cholesterol, Total  Date Value Ref Range Status  08/09/2021 198 100 - 199 mg/dL Final   LDL Chol Calc (NIH)  Date Value Ref Range Status  08/09/2021 94 0 - 99 mg/dL Final   HDL  Date Value Ref Range Status  08/09/2021 38 (L) >39 mg/dL Final   Triglycerides  Date Value Ref Range Status  08/09/2021 400 (H) 0 - 149 mg/dL Final         Passed - Patient is not pregnant      Passed - Valid encounter within last 12 months    Recent Outpatient Visits          3 months ago Annual physical exam   [facility] Glean Hess, MD   7 months ago Type II diabetes mellitus with complication [facility])   [facility] Glean Hess, MD   12 months ago Type II diabetes mellitus with complication [facility])   [facility] Glean Hess, MD   1 year ago Type II diabetes mellitus with complication [facility])   [facility] Glean Hess, MD   1 year ago Type II diabetes mellitus with complication [facility])   [facility] Glean Hess, MD      Future Appointments            In 3 weeks Army Melia Jesse Sans, MD [facility], [place]   In 8 months Army Melia, Jesse Sans, MD [facility], [facility]

## 2021-11-14 NOTE — Progress Notes (Signed)
Subjective:   Diane Morris is a 71 y.o. female who presents for Medicare Annual (Subsequent) preventive examination.  Virtual Visit via Telephone Note  I connected with  Diane Morris on 11/14/21 at 11:30 AM EDT by telephone and verified that I am speaking with the correct person using two identifiers.  Location: Patient: home Provider: [facility] Persons participating in the virtual visit: [place]   I discussed the limitations, risks, security and privacy concerns of performing an evaluation and management service by telephone and the availability of in person appointments. The patient expressed understanding and agreed to proceed.  Interactive audio and video telecommunications were attempted between this nurse and patient, however failed, due to patient having technical difficulties OR patient did not have access to video capability.  We continued and completed visit with audio only.  Some vital signs may be absent or patient reported.   Clemetine Marker, LPN   Review of Systems     Cardiac Risk Factors include: advanced age (>22mn, >>57women);diabetes mellitus;dyslipidemia;hypertension;obesity (BMI >30kg/m2)     Objective:    There were no vitals filed for this visit. There is no height or weight on file to calculate BMI.     11/14/2021   11:44 AM 11/10/2020   11:34 AM 11/05/2019   11:35 AM 06/04/2019   10:30 AM 01/03/2018    7:37 AM 02/14/2017    1:10 PM 10/09/2016    7:02 AM  Advanced Directives  Does Patient Have a Medical Advance Directive? No No No No No No No  Would patient like information on creating a medical advance directive? No - Patient declined No - Patient declined Yes (MAU/Ambulatory/Procedural Areas - Information given)  Yes (MAU/Ambulatory/Procedural Areas - Information given)  No - Patient declined    Current Medications (verified) Outpatient Encounter Medications as of 11/14/2021  Medication Sig   atorvastatin (LIPITOR) 80 MG tablet TAKE  1 TABLET BY MOUTH EVERY DAY   Cetirizine HCl (ALLERGY, CETIRIZINE, PO) Take by mouth.   cyclobenzaprine (FLEXERIL) 10 MG tablet TAKE 1 TABLET (10 MG TOTAL) BY MOUTH AT BEDTIME. RESTLESS LEG- USUALLY AT BEDTIME.   dapagliflozin propanediol (FARXIGA) 10 MG TABS tablet Take 1 tablet (10 mg total) by mouth daily.   gabapentin (NEURONTIN) 300 MG capsule Take 1 capsule (300 mg total) by mouth daily. Sciatic Nerve Pain- taking only at bedtime.   Ibuprofen 200 MG CAPS Take by mouth.   lisinopril-hydrochlorothiazide (ZESTORETIC) 10-12.5 MG tablet TAKE 1 TABLET BY MOUTH EVERY DAY   meclizine (ANTIVERT) 25 MG tablet Take 12.5 mg by mouth daily. OTC daily.   metoprolol succinate (TOPROL-XL) 100 MG 24 hr tablet TAKE 1 TABLET (100 MG TOTAL) BY MOUTH EVERY EVENING. TAKE WITH OR IMMEDIATELY FOLLOWING A MEAL.   omeprazole (PRILOSEC) 20 MG capsule Take 20 mg by mouth daily. OTC   OTEZLA 30 MG TABS Take 1 tablet by mouth daily.   VENTOLIN HFA 108 (90 Base) MCG/ACT inhaler TAKE 2 PUFFS BY MOUTH EVERY 6 HOURS AS NEEDED FOR WHEEZE OR SHORTNESS OF BREATH   fenofibrate (TRICOR) 145 MG tablet TAKE 1 TABLET BY MOUTH EVERY DAY (Patient not taking: Reported on 11/14/2021)   venlafaxine XR (EFFEXOR-XR) 37.5 MG 24 hr capsule Take 1 capsule (37.5 mg total) by mouth daily with breakfast. (Patient not taking: Reported on 11/14/2021)   No facility-administered encounter medications on file as of 11/14/2021.    Allergies (verified) Contrast media [iodinated contrast media], Bee venom, Codeine, Shellfish allergy, and Sodium clavulanate   History:  Past Medical History:  Diagnosis Date   BPPV (benign paroxysmal positional vertigo)    Depression    Depression 11/09/2015   Diabetes mellitus type 2, noninsulin dependent (HCC)    Dysrhythmia    tachycardia - controlled with metoprolol   GERD (gastroesophageal reflux disease)    Hepatitis A    as teenager. treated   Hepatitis B 12/2015   positive test, patient states she does not  have hepatitis B, last panel was negative which was the 3rd panel   Hx of blood clots 2016   left calf   Hypercholesteremia    Hypertension    Long term current use of opiate analgesic 02/14/2017   Long term prescription opiate use 02/14/2017   Opiate use 02/14/2017   Psoriatic arthritis ([place])    Sciatica of left side    Vertigo    when anxious   Past Surgical History:  Procedure Laterality Date   COLONOSCOPY     COLONOSCOPY WITH PROPOFOL N/A 10/09/2016   Procedure: COLONOSCOPY WITH PROPOFOL;  Surgeon: [name], MD;  Location: [place];  Service: Endoscopy;  Laterality: N/A;  Diabetic - oral meds   DILATION AND CURETTAGE OF UTERUS     ESOPHAGOGASTRODUODENOSCOPY (EGD) WITH PROPOFOL N/A 01/03/2018   Procedure: ESOPHAGOGASTRODUODENOSCOPY (EGD) WITH PROPOFOL;  Surgeon: [name], MD;  Location: [place];  Service: Endoscopy;  Laterality: N/A;  Diabetic - oral meds   INDUCED ABORTION     POLYPECTOMY  10/09/2016   Procedure: POLYPECTOMY;  Surgeon: [name], MD;  Location: [place];  Service: Endoscopy;;   TUBAL LIGATION     Family History  Problem Relation Age of Onset   Congestive Heart Failure Mother    Emphysema Mother    Heart disease Father    Hypertension Sister    Cancer Brother    Hypertension Brother    Breast cancer Neg Hx    Social History   Socioeconomic History   Marital status: Widowed    Spouse name: Not on file   Number of children: 2   Years of education: Not on file   Highest education level: Not on file  Occupational History   Not on file  Tobacco Use   Smoking status: Former    Packs/day: 1.00    Years: 18.00    Pack years: 18.00    Types: Cigarettes    Quit date: 06/12/2006    Years since quitting: 15.4   Smokeless tobacco: Never  Vaping Use   Vaping Use: Never used  Substance and Sexual Activity   Alcohol use: No   Drug use: No   Sexual activity: Not on file  Other Topics Concern   Not on file  Social  History Narrative   Pt lives alone. 2 children live in [place].    Social Determinants of Health   Financial Resource Strain: Low Risk    Difficulty of Paying Living Expenses: Not hard at all  Food Insecurity: No Food Insecurity   Worried About Charity fundraiser in the Last Year: Never true   [place] in the Last Year: Never true  Transportation Needs: No Transportation Needs   Lack of Transportation (Medical): No   Lack of Transportation (Non-Medical): No  Physical Activity: Inactive   Days of Exercise per Week: 0 days   Minutes of Exercise per Session: 0 min  Stress: No Stress Concern Present   Feeling of Stress : Not at all  Social Connections: Socially Isolated  Frequency of Communication with Friends and Family: More than three times a week   Frequency of Social Gatherings with Friends and Family: Twice a week   Attends Religious Services: Never   Marine scientist or Organizations: No   Attends Archivist Meetings: Never   Marital Status: Widowed    Tobacco Counseling Counseling given: Not Answered   Clinical Intake:  Pre-visit preparation completed: Yes  Pain : No/denies pain     Nutritional Risks: None Diabetes: Yes CBG done?: No Did pt. bring in CBG monitor from home?: No  How often do you need to have someone help you when you read instructions, pamphlets, or other written materials from your doctor or pharmacy?: 1 - Never  Nutrition Risk Assessment:  Has the patient had any N/V/D within the last 2 months?  No  Does the patient have any non-healing wounds?  No  Has the patient had any unintentional weight loss or weight gain?  No   Diabetes:  Is the patient diabetic?  Yes  If diabetic, was a CBG obtained today?  No  Did the patient bring in their glucometer from home?  No  How often do you monitor your CBG's? Pt does not currently check blood sugar at home.   Financial Strains and Diabetes Management:  Are you having  any financial strains with the device, your supplies or your medication? No .  Does the patient want to be seen by Chronic Care Management for management of their diabetes?  No  Would the patient like to be referred to a Nutritionist or for Diabetic Management?  No   Diabetic Exams:  Diabetic Eye Exam: Completed 02/20/18; scheduled for 02/2022.   Diabetic Foot Exam: Completed 08/09/21.   Interpreter Needed?: No  Information entered by :: Clemetine Marker LPN   Activities of Daily Living    11/14/2021   11:45 AM 08/09/2021   10:40 AM  In your present state of health, do you have any difficulty performing the following activities:  Hearing? 0 0  Vision? 0 0  Difficulty concentrating or making decisions? 0 0  Walking or climbing stairs? 1 0  Dressing or bathing? 0 0  Doing errands, shopping? 0 0  Preparing Food and eating ? N   Using the Toilet? N   In the past six months, have you accidently leaked urine? N   Do you have problems with loss of bowel control? N   Managing your Medications? N   Managing your Finances? N   Housekeeping or managing your Housekeeping? N     Patient Care Team: Glean Hess, MD as PCP - General (Internal Medicine) [name], MD as Consulting Physician (Gastroenterology) Eulogio Bear, MD as Consulting Physician (Ophthalmology) Quintin Alto, MD as Consulting Physician (Rheumatology)  Indicate any recent Medical Services you may have received from other than [facility] providers in the past year (date may be approximate).     Assessment:   This is a routine wellness examination for Diane Morris.  Hearing/Vision screen Hearing Screening - Comments:: Pt denies hearing difficulty Vision Screening - Comments:: [facility] screen[facility]; due for exam   Dietary issues and exercise activities discussed: Current Exercise Habits: The patient does not participate in regular exercise at present, Exercise limited by: None  identified   Goals Addressed   None    Depression Screen    11/14/2021   11:43 AM 08/09/2021   10:39 AM 03/24/2021   10:31 AM  11/19/2020   10:20 AM 11/10/2020   11:32 AM 08/06/2020   10:14 AM 03/10/2020    2:28 PM  PHQ 2/9 Scores  PHQ - 2 Score 0 0 0 0 0 4 2  PHQ- 9 Score  0 3 0  6 4    Fall Risk    11/14/2021   11:45 AM 08/09/2021   10:40 AM 03/24/2021   10:32 AM 11/19/2020   10:20 AM 11/10/2020   11:35 AM  Fall Risk   Falls in the past year? 0 0 0 0 0  Number falls in past yr: 0 0 0  0  Injury with Fall? 0 0 0  0  Risk for fall due to : No Fall Risks No Fall Risks   No Fall Risks  Follow up Falls prevention discussed Falls evaluation completed Falls evaluation completed Falls evaluation completed Falls prevention discussed    FALL RISK PREVENTION PERTAINING TO THE HOME:  Any stairs in or around the home? No  If so, are there any without handrails? No  Home free of loose throw rugs in walkways, pet beds, electrical cords, etc? Yes  Adequate lighting in your home to reduce risk of falls? Yes   ASSISTIVE DEVICES UTILIZED TO PREVENT FALLS:  Life alert? No  Use of a cane, walker or w/c? No  Grab bars in the bathroom? No  Shower chair or bench in shower? No  Elevated toilet seat or a handicapped toilet? No   TIMED UP AND GO:  Was the test performed? No . Telephonic visit   Cognitive Function: Normal cognitive status assessed by direct observation by this Nurse Health Advisor. No abnormalities found.          11/05/2019   11:40 AM  6CIT Screen  What Year? 0 points  What month? 0 points  What time? 0 points  Count back from 20 0 points  Months in reverse 0 points  Repeat phrase 0 points  Total Score 0 points    Immunizations Immunization History  Administered Date(s) Administered   Fluad Quad(high Dose 65+) 03/10/2020, 03/24/2021   Moderna Sars-Covid-2 Vaccination 09/24/2019, 10/22/2019, 04/26/2020   PNEUMOCOCCAL CONJUGATE-20 08/09/2021   Pneumococcal  Conjugate-13 08/06/2020    TDAP status: Due, Education has been provided regarding the importance of this vaccine. Advised may receive this vaccine at local pharmacy or Health Dept. Aware to provide a copy of the vaccination record if obtained from local pharmacy or Health Dept. Verbalized acceptance and understanding.  Flu Vaccine status: Up to date  Pneumococcal vaccine status: Up to date  Covid-19 vaccine status: Completed vaccines  Qualifies for Shingles Vaccine? Yes   Zostavax completed No   Shingrix Completed?: No.    Education has been provided regarding the importance of this vaccine. Patient has been advised to call insurance company to determine out of pocket expense if they have not yet received this vaccine. Advised may also receive vaccine at local pharmacy or Health Dept. Verbalized acceptance and understanding.  Screening Tests Health Maintenance  Topic Date Due   TETANUS/TDAP  Never done   Zoster Vaccines- Shingrix (1 of 2) Never done   OPHTHALMOLOGY EXAM  02/21/2019   COVID-19 Vaccine (4 - Booster for Moderna series) 06/21/2020   MAMMOGRAM  08/24/2021   COLONOSCOPY (Pts 45-39yr Insurance coverage will need to be confirmed)  10/09/2021   INFLUENZA VACCINE  01/10/2022   HEMOGLOBIN A1C  02/06/2022   FOOT EXAM  08/09/2022   Pneumonia Vaccine 71 Years old  Completed  DEXA SCAN  Completed   Hepatitis C Screening  Completed   HPV VACCINES  Aged Out    Health Maintenance  Health Maintenance Due  Topic Date Due   TETANUS/TDAP  Never done   Zoster Vaccines- Shingrix (1 of 2) Never done   OPHTHALMOLOGY EXAM  02/21/2019   COVID-19 Vaccine (4 - Booster for Moderna series) 06/21/2020   MAMMOGRAM  08/24/2021   COLONOSCOPY (Pts 45-29yr Insurance coverage will need to be confirmed)  10/09/2021    Colorectal cancer screening: Type of screening: Colonoscopy. Completed 10/09/16. Repeat every 5 years. Pt aware due for repeat screening colonoscopy and plans to schedule when  transportation is available due to not having anyone to take her for procedure.   Mammogram status: Completed 08/24/20. Repeat every year.Scheduled for 12/01/21  Bone Density status: Completed 08/24/20. Results reflect: Bone density results: OSTEOPENIA. Repeat every 2 years.  Lung Cancer Screening: (Low Dose CT Chest recommended if Age 69-80years, 30 pack-year currently smoking OR have quit w/in 15years.) does not qualify.   Additional Screening:  Hepatitis C Screening: does qualify; Completed 07/04/19  Vision Screening: Recommended annual ophthalmology exams for early detection of glaucoma and other disorders of the eye. Is the patient up to date with their annual eye exam?  No  Who is the provider or what is the [facility] of [facility] the patient [facility]   Dental Screening: Recommended annual dental exams for proper oral hygiene  Community Resource Referral / Chronic Care Management: CRR required this visit?  No   CCM required this visit?  No      Plan:     I have personally reviewed and noted the following in the patient's chart:   Medical and social history Use of alcohol, tobacco or illicit drugs  Current medications and supplements including opioid prescriptions.  Functional ability and status Nutritional status Physical activity Advanced directives List of other physicians Hospitalizations, surgeries, and ER visits in previous 12 months Vitals Screenings to include cognitive, depression, and falls Referrals and appointments  In addition, I have reviewed and discussed with patient certain preventive protocols, quality metrics, and best practice recommendations. A written personalized care plan for preventive services as well as general preventive health recommendations were provided to patient.     KClemetine Marker LPN   [id number]  Nurse Notes: patient states she has been unable to get a refill for her fenofibrate. Please  send new rx to [facility]. Pt is also currently not taking venlafaxine and plans to discuss with Dr. BArmy Meliaat her appt on 12/07/21.

## 2021-11-14 NOTE — Patient Instructions (Signed)
Diane Morris , Thank you for taking time to come for your Medicare Wellness Visit. I appreciate your ongoing commitment to your health goals. Please review the following plan we discussed and let me know if I can assist you in the future.   Screening recommendations/referrals: Colonoscopy: done 10/09/16. Due for repeat screening. Please contact [facility] to schedule a colonoscopy once transportation arrangements are available. Mammogram: done 08/24/20; scheduled for 12/01/21 Bone Density: done 08/24/20 Recommended yearly ophthalmology/optometry visit for glaucoma screening and checkup Recommended yearly dental visit for hygiene and checkup  Vaccinations: Influenza vaccine: done 03/24/21 Pneumococcal vaccine: done 08/09/21 Tdap vaccine: due Shingles vaccine: Shingrix discussed. Please contact your pharmacy for coverage information.  Covid-19:done 09/24/19, 10/22/19 & 04/26/20  Advanced directives: Please bring a copy of your health care power of attorney and living will to the office at your convenience once you have completed that paperwork  Conditions/risks identified: Keep up the great work!  Next appointment: Follow up in one year for your annual wellness visit    Preventive Care 65 Years and Older, Female Preventive care refers to lifestyle choices and visits with your health care provider that can promote health and wellness. What does preventive care include? A yearly physical exam. This is also called an annual well check. Dental exams once or twice a year. Routine eye exams. Ask your health care provider how often you should have your eyes checked. Personal lifestyle choices, including: Daily care of your teeth and gums. Regular physical activity. Eating a healthy diet. Avoiding tobacco and drug use. Limiting alcohol use. Practicing safe sex. Taking low-dose aspirin every day. Taking vitamin and mineral supplements as recommended by your health care  provider. What happens during an annual well check? The services and screenings done by your health care provider during your annual well check will depend on your age, overall health, lifestyle risk factors, and family history of disease. Counseling  Your health care provider may ask you questions about your: Alcohol use. Tobacco use. Drug use. Emotional well-being. Home and relationship well-being. Sexual activity. Eating habits. History of falls. Memory and ability to understand (cognition). Work and work Statistician. Reproductive health. Screening  You may have the following tests or measurements: Height, weight, and BMI. Blood pressure. Lipid and cholesterol levels. These may be checked every 5 years, or more frequently if you are over 66 years old. Skin check. Lung cancer screening. You may have this screening every year starting at age 17 if you have a 30-pack-year history of smoking and currently smoke or have quit within the past 15 years. Fecal occult blood test (FOBT) of the stool. You may have this test every year starting at age 6. Flexible sigmoidoscopy or colonoscopy. You may have a sigmoidoscopy every 5 years or a colonoscopy every 10 years starting at age 30. Hepatitis C blood test. Hepatitis B blood test. Sexually transmitted disease (STD) testing. Diabetes screening. This is done by checking your blood sugar (glucose) after you have not eaten for a while (fasting). You may have this done every 1-3 years. Bone density scan. This is done to screen for osteoporosis. You may have this done starting at age 63. Mammogram. This may be done every 1-2 years. Talk to your health care provider about how often you should have regular mammograms. Talk with your health care provider about your test results, treatment options, and if necessary, the need for more tests. Vaccines  Your health care provider may recommend certain vaccines, such as: Influenza vaccine. This  is  recommended every year. Tetanus, diphtheria, and acellular pertussis (Tdap, Td) vaccine. You may need a Td booster every 10 years. Zoster vaccine. You may need this after age 32. Pneumococcal 13-valent conjugate (PCV13) vaccine. One dose is recommended after age 68. Pneumococcal polysaccharide (PPSV23) vaccine. One dose is recommended after age 35. Talk to your health care provider about which screenings and vaccines you need and how often you need them. This information is not intended to replace advice given to you by your health care provider. Make sure you discuss any questions you have with your health care provider. Document Released: 06/25/2015 Document Revised: 02/16/2016 Document Reviewed: 03/30/2015 Elsevier Interactive Patient Education  2017 [employer] Prevention in the Home Falls can cause injuries. They can happen to people of all ages. There are many things you can do to make your home safe and to help prevent falls. What can I do on the outside of my home? Regularly fix the edges of walkways and driveways and fix any cracks. Remove anything that might make you trip as you walk through a door, such as a raised step or threshold. Trim any bushes or trees on the path to your home. Use bright outdoor lighting. Clear any walking paths of anything that might make someone trip, such as rocks or tools. Regularly check to see if handrails are loose or broken. Make sure that both sides of any steps have handrails. Any raised decks and porches should have guardrails on the edges. Have any leaves, snow, or ice cleared regularly. Use sand or salt on walking paths during winter. Clean up any spills in your garage right away. This includes oil or grease spills. What can I do in the bathroom? Use night lights. Install grab bars by the toilet and in the tub and shower. Do not use towel bars as grab bars. Use non-skid mats or decals in the tub or shower. If you need to sit down in  the shower, use a plastic, non-slip stool. Keep the floor dry. Clean up any water that spills on the floor as soon as it happens. Remove soap buildup in the tub or shower regularly. Attach bath mats securely with double-sided non-slip rug tape. Do not have throw rugs and other things on the floor that can make you trip. What can I do in the bedroom? Use night lights. Make sure that you have a light by your bed that is easy to reach. Do not use any sheets or blankets that are too big for your bed. They should not hang down onto the floor. Have a firm chair that has side arms. You can use this for support while you get dressed. Do not have throw rugs and other things on the floor that can make you trip. What can I do in the kitchen? Clean up any spills right away. Avoid walking on wet floors. Keep items that you use a lot in easy-to-reach places. If you need to reach something above you, use a strong step stool that has a grab bar. Keep electrical cords out of the way. Do not use floor polish or wax that makes floors slippery. If you must use wax, use non-skid floor wax. Do not have throw rugs and other things on the floor that can make you trip. What can I do with my stairs? Do not leave any items on the stairs. Make sure that there are handrails on both sides of the stairs and use them. Fix handrails that  are broken or loose. Make sure that handrails are as long as the stairways. Check any carpeting to make sure that it is firmly attached to the stairs. Fix any carpet that is loose or worn. Avoid having throw rugs at the top or bottom of the stairs. If you do have throw rugs, attach them to the floor with carpet tape. Make sure that you have a light switch at the top of the stairs and the bottom of the stairs. If you do not have them, ask someone to add them for you. What else can I do to help prevent falls? Wear shoes that: Do not have high heels. Have rubber bottoms. Are comfortable  and fit you well. Are closed at the toe. Do not wear sandals. If you use a stepladder: Make sure that it is fully opened. Do not climb a closed stepladder. Make sure that both sides of the stepladder are locked into place. Ask someone to hold it for you, if possible. Clearly mark and make sure that you can see: Any grab bars or handrails. First and last steps. Where the edge of each step is. Use tools that help you move around (mobility aids) if they are needed. These include: Canes. Walkers. Scooters. Crutches. Turn on the lights when you go into a dark area. Replace any light bulbs as soon as they burn out. Set up your furniture so you have a clear path. Avoid moving your furniture around. If any of your floors are uneven, fix them. If there are any pets around you, be aware of where they are. Review your medicines with your doctor. Some medicines can make you feel dizzy. This can increase your chance of falling. Ask your doctor what other things that you can do to help prevent falls. This information is not intended to replace advice given to you by your health care provider. Make sure you discuss any questions you have with your health care provider. Document Released: 03/25/2009 Document Revised: 11/04/2015 Document Reviewed: 07/03/2014 Elsevier Interactive Patient Education  2017 [employer].

## 2021-12-01 ENCOUNTER — Ambulatory Visit
Admission: RE | Admit: 2021-12-01 | Discharge: 2021-12-01 | Disposition: A | Discharge status: Home / Self Care | Payer: Medicare HMO | Source: Ambulatory Visit | Attending: Internal Medicine | Admitting: Internal Medicine

## 2021-12-01 DIAGNOSIS — Z1231 Encounter for screening mammogram for malignant neoplasm of breast: Secondary | ICD-10-CM | POA: Diagnosis not present

## 2021-12-07 ENCOUNTER — Other Ambulatory Visit: Payer: Self-pay | Admitting: Internal Medicine

## 2021-12-07 ENCOUNTER — Encounter: Payer: Self-pay | Admitting: Internal Medicine

## 2021-12-07 ENCOUNTER — Ambulatory Visit (INDEPENDENT_AMBULATORY_CARE_PROVIDER_SITE_OTHER): Payer: Medicare HMO | Admitting: Internal Medicine

## 2021-12-07 VITALS — BP 130/70 | HR 81 | Ht 67.0 in | Wt 233.0 lb

## 2021-12-07 DIAGNOSIS — E785 Hyperlipidemia, unspecified: Secondary | ICD-10-CM | POA: Diagnosis not present

## 2021-12-07 DIAGNOSIS — G2581 Restless legs syndrome: Secondary | ICD-10-CM

## 2021-12-07 DIAGNOSIS — E1169 Type 2 diabetes mellitus with other specified complication: Secondary | ICD-10-CM | POA: Diagnosis not present

## 2021-12-07 DIAGNOSIS — H8113 Benign paroxysmal vertigo, bilateral: Secondary | ICD-10-CM

## 2021-12-07 DIAGNOSIS — D126 Benign neoplasm of colon, unspecified: Secondary | ICD-10-CM | POA: Diagnosis not present

## 2021-12-07 DIAGNOSIS — E118 Type 2 diabetes mellitus with unspecified complications: Secondary | ICD-10-CM | POA: Diagnosis not present

## 2021-12-07 DIAGNOSIS — I10 Essential (primary) hypertension: Secondary | ICD-10-CM | POA: Diagnosis not present

## 2021-12-07 NOTE — Progress Notes (Signed)
Date:  12/07/2021   Name:  Diane Morris   DOB:  04/15/1951   MRN:  [id number]   Chief Complaint: Hypertension and Diabetes  Hypertension This is a chronic problem. The problem is controlled. Pertinent negatives include no chest pain, headaches, palpitations or shortness of breath. Past treatments include beta blockers, ACE inhibitors and diuretics. The current treatment provides significant improvement.  Diabetes She presents for her follow-up diabetic visit. She has type 2 diabetes mellitus. Pertinent negatives for hypoglycemia include no headaches, nervousness/anxiousness or tremors. Pertinent negatives for diabetes include no chest pain, no fatigue, no polydipsia and no polyuria. Current diabetic treatment includes oral agent (monotherapy) (farxiga).  Hyperlipidemia This is a chronic problem. Recent lipid tests were reviewed and are high. Pertinent negatives include no chest pain or shortness of breath. Current antihyperlipidemic treatment includes fibric acid derivatives and statins. The current treatment provides moderate improvement of lipids.  Depression        This is a chronic problem.  The problem has been gradually worsening since onset.  Associated symptoms include no fatigue, no appetite change, no headaches and no suicidal ideas.  Past treatments include SNRIs - Serotonin and norepinephrine reuptake inhibitors (recently stopped effexor).   Lab Results  Component Value Date   NA 138 08/09/2021   K 5.1 08/09/2021   CO2 24 08/09/2021   GLUCOSE 124 (H) 08/09/2021   BUN 13 08/09/2021   CREATININE 0.65 08/09/2021   CALCIUM 10.2 08/09/2021   EGFR 96 08/09/2021   GFRNONAA 92 08/06/2020   Lab Results  Component Value Date   CHOL 198 08/09/2021   HDL 38 (L) 08/09/2021   LDLCALC 94 08/09/2021   TRIG 400 (H) 08/09/2021   CHOLHDL 5.2 (H) 08/09/2021   Lab Results  Component Value Date   TSH 2.300 08/09/2021   Lab Results  Component Value Date   HGBA1C 6.6 (H)  08/09/2021   Lab Results  Component Value Date   WBC 11.0 (H) 08/09/2021   HGB 15.8 08/09/2021   HCT 47.7 (H) 08/09/2021   MCV 86 08/09/2021   PLT 342 08/09/2021   Lab Results  Component Value Date   ALT 26 08/09/2021   AST 20 08/09/2021   ALKPHOS 181 (H) 08/09/2021   BILITOT 0.5 08/09/2021   Lab Results  Component Value Date   25OHVITD2 <1.0 02/14/2017   25OHVITD3 11 02/14/2017     Review of Systems  Constitutional:  Positive for unexpected weight change (has lost 13 lbs since last visit). Negative for appetite change, fatigue and fever.  HENT:  Negative for tinnitus and trouble swallowing.   Eyes:  Negative for visual disturbance.  Respiratory:  Negative for cough, chest tightness and shortness of breath.   Cardiovascular:  Negative for chest pain, palpitations and leg swelling.  Gastrointestinal:  Negative for abdominal pain.  Endocrine: Negative for polydipsia and polyuria.  Genitourinary:  Negative for dysuria and hematuria.  Musculoskeletal:  Negative for arthralgias.  Neurological:  Negative for tremors, numbness and headaches.  Psychiatric/Behavioral:  Positive for depression and dysphoric mood. Negative for sleep disturbance and suicidal ideas. The patient is not nervous/anxious.     Patient Active Problem List   Diagnosis Date Noted   Mood disorder ([place]) 12/08/2019   Hepatic steatosis 12/07/2019   Elevated alkaline phosphatase level 08/08/2019   Hyperlipidemia associated with type 2 diabetes mellitus ([place]) 08/06/2019   Essential hypertension 01/31/2019   GERD without esophagitis 01/31/2019   Type II diabetes mellitus with complication ([place]) 32/91/9166  Morbid obesity ([place]) 01/31/2019   Benign paroxysmal positional vertigo due to bilateral vestibular disorder 01/31/2019   History of gastritis    Vitamin D deficiency 02/19/2017   Chronic low back pain (Primary Area of Pain) (L>R) 02/14/2017   Chronic hip pain (Secondary Area of Pain) (L>R) 02/14/2017    Chronic right shoulder pain (Tertiary Area of Pain) 02/14/2017   Bilateral chronic knee pain (Fourth Area of Pain) (L>R) 02/14/2017   Chronic neck pain 02/14/2017   Chronic upper extremity pain 02/14/2017   Chronic sacroiliac joint pain 02/14/2017   Adenomatous polyp of colon    CTS (carpal tunnel syndrome) 08/28/2016   Hepatitis B surface antigen positive 11/19/2015   Enthesitis 11/19/2015   Facet joint disease of lumbosacral region ([place]) 11/19/2015   Chronic bilateral low back pain with left-sided sciatica 11/09/2015   Psoriasis 11/09/2015   Psoriatic arthritis ([place]) 11/09/2015   Tendinitis of right foot 11/09/2015    Allergies  Allergen Reactions   Contrast Media [Iodinated Contrast Media] Shortness Of Breath   Bee Venom Swelling   Codeine Nausea And Vomiting        Shellfish Allergy Nausea And Vomiting, Swelling and Rash        Sodium Clavulanate Diarrhea    Past Surgical History:  Procedure Laterality Date   COLONOSCOPY     COLONOSCOPY WITH PROPOFOL N/A 10/09/2016   Procedure: COLONOSCOPY WITH PROPOFOL;  Surgeon: [name], MD;  Location: [facility];  Service: Endoscopy;  Laterality: N/A;  Diabetic - oral meds   DILATION AND CURETTAGE OF UTERUS     ESOPHAGOGASTRODUODENOSCOPY (EGD) WITH PROPOFOL N/A 01/03/2018   Procedure: ESOPHAGOGASTRODUODENOSCOPY (EGD) WITH PROPOFOL;  Surgeon: [name], MD;  Location: [facility];  Service: Endoscopy;  Laterality: N/A;  Diabetic - oral meds   INDUCED ABORTION     POLYPECTOMY  10/09/2016   Procedure: POLYPECTOMY;  Surgeon: [name], MD;  Location: [place];  Service: Endoscopy;;   TUBAL LIGATION      Social History   Tobacco Use   Smoking status: Former    Packs/day: 1.00    Years: 18.00    Total pack years: 18.00    Types: Cigarettes    Quit date: 06/12/2006    Years since quitting: 15.4   Smokeless tobacco: Never  Vaping Use   Vaping Use: Never used  Substance Use Topics   Alcohol use: No    Drug use: No     Medication list has been reviewed and updated.  Current Meds  Medication Sig   atorvastatin (LIPITOR) 80 MG tablet TAKE 1 TABLET BY MOUTH EVERY DAY   Cetirizine HCl (ALLERGY, CETIRIZINE, PO) Take by mouth.   cyclobenzaprine (FLEXERIL) 10 MG tablet TAKE 1 TABLET (10 MG TOTAL) BY MOUTH AT BEDTIME. RESTLESS LEG- USUALLY AT BEDTIME.   dapagliflozin propanediol (FARXIGA) 10 MG TABS tablet Take 1 tablet (10 mg total) by mouth daily.   fenofibrate (TRICOR) 145 MG tablet Take 1 tablet (145 mg total) by mouth daily.   gabapentin (NEURONTIN) 300 MG capsule Take 1 capsule (300 mg total) by mouth daily. Sciatic Nerve Pain- taking only at bedtime.   Ibuprofen 200 MG CAPS Take by mouth.   lisinopril-hydrochlorothiazide (ZESTORETIC) 10-12.5 MG tablet TAKE 1 TABLET BY MOUTH EVERY DAY   meclizine (ANTIVERT) 25 MG tablet Take 12.5 mg by mouth daily. OTC daily.   metoprolol succinate (TOPROL-XL) 100 MG 24 hr tablet TAKE 1 TABLET (100 MG TOTAL) BY MOUTH EVERY EVENING. TAKE WITH OR IMMEDIATELY  FOLLOWING A MEAL.   omeprazole (PRILOSEC) 20 MG capsule Take 20 mg by mouth daily. OTC   OTEZLA 30 MG TABS Take 1 tablet by mouth daily.   venlafaxine XR (EFFEXOR-XR) 37.5 MG 24 hr capsule Take 37.5 mg by mouth daily with breakfast.   VENTOLIN HFA 108 (90 Base) MCG/ACT inhaler TAKE 2 PUFFS BY MOUTH EVERY 6 HOURS AS NEEDED FOR WHEEZE OR SHORTNESS OF BREATH       12/07/2021    9:37 AM 08/09/2021   10:39 AM 03/24/2021   10:31 AM 11/19/2020   10:20 AM  GAD 7 : Generalized Anxiety Score  Nervous, Anxious, on Edge 0 0 0 0  Control/stop worrying 0 0 0 0  Worry too much - different things 0 0 0 0  Trouble relaxing 0 0 0 0  Restless 0 0 0 0  Easily annoyed or irritable 0 0 0 0  Afraid - awful might happen 0 0 0 0  Total GAD 7 Score 0 0 0 0  Anxiety Difficulty Not difficult at all Not difficult at all         12/07/2021    9:37 AM  Depression screen PHQ 2/9  Decreased Interest 0  Down,  Depressed, Hopeless 0  PHQ - 2 Score 0  Altered sleeping 0  Tired, decreased energy 0  Change in appetite 0  Feeling bad or failure about yourself  0  Trouble concentrating 0  Moving slowly or fidgety/restless 0  Suicidal thoughts 0  PHQ-9 Score 0  Difficult doing work/chores Not difficult at all    BP Readings from Last 3 Encounters:  12/07/21 130/70  08/09/21 122/80  06/08/21 134/73    Physical Exam Vitals and nursing note reviewed.  Constitutional:      General: She is not in acute distress.    Appearance: She is well-developed.  HENT:     Head: Normocephalic and atraumatic.  Cardiovascular:     Rate and Rhythm: Normal rate and regular rhythm.  Pulmonary:     Effort: Pulmonary effort is normal. No respiratory distress.     Breath sounds: No wheezing or rhonchi.  Musculoskeletal:     Cervical back: Normal range of motion.     Right lower leg: No edema.     Left lower leg: No edema.  Lymphadenopathy:     Cervical: No cervical adenopathy.  Skin:    General: Skin is warm and dry.     Findings: No rash.  Neurological:     Mental Status: She is alert and oriented to person, place, and time.  Psychiatric:        Mood and Affect: Mood normal.        Behavior: Behavior normal.        Thought Content: Thought content normal.     Wt Readings from Last 3 Encounters:  12/07/21 233 lb (105.7 kg)  08/18/21 247 lb (112 kg)  08/09/21 246 lb (111.6 kg)    BP 130/70   Pulse 81   Ht $R'5\' 7"'so$  (1.702 m)   Wt 233 lb (105.7 kg)   SpO2 97%   BMI 36.49 kg/m   Assessment and Plan: 1. Essential hypertension Clinically stable exam with well controlled BP. Tolerating medications without side effects at this time. Pt to continue current regimen and low sodium diet; benefits of regular exercise as able discussed.  2. Type II diabetes mellitus with complication ([place]) Clinically stable by exam and report without s/s of hypoglycemia. DM complicated by hypertension  and  dyslipidemia. Tolerating medications well without side effects or other concerns. - Basic metabolic panel - Hemoglobin A1c - Microalbumin / creatinine urine ratio  3. Hyperlipidemia associated with type 2 diabetes mellitus ([place]) Tolerating statin medication without side effects at this time LDL is at not at goal of < 70 on current dose. Consider change to Crestor. Continue same therapy without change at this time.  4. Benign paroxysmal positional vertigo due to bilateral vestibular disorder Intermittent symptoms unchanged.  5. Adenomatous polyp of colon, unspecified part of colon Recommend trying to arrange transportation for repeat colonoscopy in the next 1-2 years due to hx of TA - one polyp seen in 2018   Partially dictated using [employer]. Any errors are unintentional.  Halina Maidens, MD [facility]  12/07/2021

## 2021-12-07 NOTE — Telephone Encounter (Signed)
Requested medication (s) are due for refill today: yes  Requested medication (s) are on the active medication list: yes  Last refill:  08/10/21 #30 with 3 RF  Future visit scheduled: today  Notes to clinic:  This medication can not be delegated, please assess.        Requested Prescriptions  Pending Prescriptions Disp Refills   cyclobenzaprine (FLEXERIL) 10 MG tablet [Pharmacy Med Name: CYCLOBENZAPRINE 10 MG TABLET] 30 tablet 3    Sig: TAKE 1 TABLET (10 MG TOTAL) BY MOUTH AT BEDTIME. RESTLESS LEG- USUALLY AT BEDTIME.     Not Delegated - Analgesics:  Muscle Relaxants Failed - 12/07/2021  1:56 AM      Failed - This refill cannot be delegated      Passed - Valid encounter within last 6 months    Recent Outpatient Visits           4 months ago Annual physical exam   [facility] Glean Hess, MD   8 months ago Type II diabetes mellitus with complication [facility])   [facility] Glean Hess, MD   1 year ago Type II diabetes mellitus with complication [facility])   [facility] Glean Hess, MD   1 year ago Type II diabetes mellitus with complication [facility])   [facility] Glean Hess, MD   1 year ago Type II diabetes mellitus with complication [facility])   [facility] Glean Hess, MD       Future Appointments             Today Glean Hess, MD [facility], [place]   In 8 months Army Melia Jesse Sans, MD [facility], [facility]

## 2021-12-08 LAB — BASIC METABOLIC PANEL
BUN/Creatinine Ratio: 17 (ref 12–28)
BUN: 14 mg/dL (ref 8–27)
CO2: 20 mmol/L (ref 20–29)
Calcium: 10.9 mg/dL — ABNORMAL HIGH (ref 8.7–10.3)
Chloride: 98 mmol/L (ref 96–106)
Creatinine, Ser: 0.81 mg/dL (ref 0.57–1.00)
Glucose: 117 mg/dL — ABNORMAL HIGH (ref 70–99)
Potassium: 4.6 mmol/L (ref 3.5–5.2)
Sodium: 135 mmol/L (ref 134–144)
eGFR: 79 mL/min/{1.73_m2} (ref >59)

## 2021-12-08 LAB — MICROALBUMIN / CREATININE URINE RATIO
Creatinine, Urine: 65.8 mg/dL
Microalb/Creat Ratio: 5 mg/g creat (ref 0–29)
Microalbumin, Urine: 3.2 ug/mL

## 2021-12-08 LAB — HEMOGLOBIN A1C
Est. average glucose Bld gHb Est-mCnc: 128 mg/dL
Hgb A1c MFr Bld: 6.1 % — ABNORMAL HIGH (ref 4.8–5.6)

## 2021-12-20 DIAGNOSIS — L4050 Arthropathic psoriasis, unspecified: Secondary | ICD-10-CM | POA: Diagnosis not present

## 2021-12-20 DIAGNOSIS — L409 Psoriasis, unspecified: Secondary | ICD-10-CM | POA: Diagnosis not present

## 2021-12-20 DIAGNOSIS — Z79899 Other long term (current) drug therapy: Secondary | ICD-10-CM | POA: Diagnosis not present

## 2022-01-03 ENCOUNTER — Other Ambulatory Visit: Payer: Self-pay | Admitting: Internal Medicine

## 2022-01-03 DIAGNOSIS — I10 Essential (primary) hypertension: Secondary | ICD-10-CM

## 2022-01-04 NOTE — Telephone Encounter (Signed)
Requested Prescriptions  Pending Prescriptions Disp Refills  . metoprolol succinate (TOPROL-XL) 100 MG 24 hr tablet [Pharmacy Med Name: METOPROLOL SUCC ER 100 MG TAB] 90 tablet 0    Sig: TAKE 1 TABLET (100 MG TOTAL) BY MOUTH EVERY EVENING. TAKE WITH OR IMMEDIATELY FOLLOWING A MEAL.     Cardiovascular:  Beta Blockers Passed - 01/03/2022  1:33 PM      Passed - Last BP in normal range    BP Readings from Last 1 Encounters:  12/07/21 130/70         Passed - Last Heart Rate in normal range    Pulse Readings from Last 1 Encounters:  12/07/21 81         Passed - Valid encounter within last 6 months    Recent Outpatient Visits          4 weeks ago Essential hypertension   [facility] Glean Hess, MD   4 months ago Annual physical exam   [facility] Glean Hess, MD   9 months ago Type II diabetes mellitus with complication [facility])   [facility] Glean Hess, MD   1 year ago Type II diabetes mellitus with complication [facility])   [facility] Glean Hess, MD   1 year ago Type II diabetes mellitus with complication [facility])   [facility] Glean Hess, MD      Future Appointments            In 7 months Army Melia Jesse Sans, MD [facility], [facility]

## 2022-01-20 ENCOUNTER — Other Ambulatory Visit: Payer: Self-pay | Admitting: Internal Medicine

## 2022-01-20 DIAGNOSIS — I10 Essential (primary) hypertension: Secondary | ICD-10-CM

## 2022-01-20 NOTE — Telephone Encounter (Signed)
Requested Prescriptions  Pending Prescriptions Disp Refills  . lisinopril-hydrochlorothiazide (ZESTORETIC) 10-12.5 MG tablet [Pharmacy Med Name: LISINOPRIL-HCTZ 10-12.5 MG TAB] 90 tablet 0    Sig: TAKE 1 TABLET BY MOUTH EVERY DAY     Cardiovascular:  ACEI + Diuretic Combos Passed - 01/20/2022  2:37 AM      Passed - Na in normal range and within 180 days    Sodium  Date Value Ref Range Status  12/07/2021 135 134 - 144 mmol/L Final         Passed - K in normal range and within 180 days    Potassium  Date Value Ref Range Status  12/07/2021 4.6 3.5 - 5.2 mmol/L Final         Passed - Cr in normal range and within 180 days    Creatinine, Ser  Date Value Ref Range Status  12/07/2021 0.81 0.57 - 1.00 mg/dL Final         Passed - eGFR is 30 or above and within 180 days    GFR calc Af Amer  Date Value Ref Range Status  08/06/2020 106 >59 mL/min/1.73 Final    Comment:    **In accordance with recommendations from the NKF-ASN Task force,**   [facility] is in the process of updating its eGFR calculation to the   2021 CKD-EPI creatinine equation that estimates kidney function   without a race variable.    GFR calc non Af Amer  Date Value Ref Range Status  08/06/2020 92 >59 mL/min/1.73 Final   eGFR  Date Value Ref Range Status  12/07/2021 79 >59 mL/min/1.73 Final         Passed - Patient is not pregnant      Passed - Last BP in normal range    BP Readings from Last 1 Encounters:  12/07/21 130/70         Passed - Valid encounter within last 6 months    Recent Outpatient Visits          1 month ago Essential [facility]   [facility]   5 months ago Annual physical exam   [facility]   10 months ago Type II diabetes mellitus with complication [facility])   [facility]   1 year ago Type II diabetes mellitus with complication [facility])   [facility]   1 year  ago Type II diabetes mellitus with complication [facility])   [facility], [facility]      Future Appointments            In 6 months Army Melia Jesse Sans, MD [facility], [facility]

## 2022-02-01 ENCOUNTER — Other Ambulatory Visit: Payer: Self-pay | Admitting: Internal Medicine

## 2022-02-01 DIAGNOSIS — G2581 Restless legs syndrome: Secondary | ICD-10-CM

## 2022-02-01 NOTE — Telephone Encounter (Signed)
Requested medications are due for refill today.  yes  Requested medications are on the active medications list.  yes  Last refill. 12/07/2021 #30 1 refill  Future visit scheduled.   yes  Notes to clinic.  Refill not delegated.    Requested Prescriptions  Pending Prescriptions Disp Refills   cyclobenzaprine (FLEXERIL) 10 MG tablet [Pharmacy Med Name: CYCLOBENZAPRINE 10 MG TABLET] 30 tablet 1    Sig: TAKE 1 TABLET (10 MG TOTAL) BY MOUTH AT BEDTIME. RESTLESS LEG- USUALLY AT BEDTIME.     Not Delegated - Analgesics:  Muscle Relaxants Failed - 02/01/2022  2:04 AM      Failed - This refill cannot be delegated      Passed - Valid encounter within last 6 months    Recent Outpatient Visits           1 month ago Essential hypertension   [place] [facility], Jesse Sans, MD   5 months ago Annual physical exam   [place] [facility] at [facility], Jesse Sans, MD   10 months ago Type II diabetes mellitus with complication [facility])   [place] [facility] at [facility], Jesse Sans, MD   1 year ago Type II diabetes mellitus with complication [facility])   [place] [facility] and [facility] Medicine at [facility], Jesse Sans, MD   1 year ago Type II diabetes mellitus with complication [facility])   [place] [facility] at [facility], Jesse Sans, MD       Future Appointments             In 6 months Army Melia, Jesse Sans, MD [place] and Sports Medicine at [facility], [facility]

## 2022-02-04 ENCOUNTER — Other Ambulatory Visit: Payer: Self-pay | Admitting: Internal Medicine

## 2022-02-04 DIAGNOSIS — H8113 Benign paroxysmal vertigo, bilateral: Secondary | ICD-10-CM

## 2022-02-06 NOTE — Telephone Encounter (Signed)
Requested medications are due for refill today.  unsure  Requested medications are on the active medications list.  yes  Last refill. 12/07/2021 unknown quantity  Future visit scheduled.   yes  Notes to clinic.  Historical medication.    Requested Prescriptions  Pending Prescriptions Disp Refills   venlafaxine XR (EFFEXOR-XR) 37.5 MG 24 hr capsule [Pharmacy Med Name: VENLAFAXINE HCL ER 37.5 MG CAP] 90 capsule 1    Sig: TAKE 1 CAPSULE BY MOUTH DAILY WITH BREAKFAST.     Psychiatry: Antidepressants - SNRI - desvenlafaxine & venlafaxine Failed - 02/04/2022  9:53 AM      Failed - Lipid Panel in normal range within the last 12 months    Cholesterol, Total  Date Value Ref Range Status  08/09/2021 198 100 - 199 mg/dL Final   LDL Chol Calc (NIH)  Date Value Ref Range Status  08/09/2021 94 0 - 99 mg/dL Final   HDL  Date Value Ref Range Status  08/09/2021 38 (L) >39 mg/dL Final   Triglycerides  Date Value Ref Range Status  08/09/2021 400 (H) 0 - 149 mg/dL Final         Passed - Cr in normal range and within 360 days    Creatinine, Ser  Date Value Ref Range Status  12/07/2021 0.81 0.57 - 1.00 mg/dL Final         Passed - Last BP in normal range    BP Readings from Last 1 Encounters:  12/07/21 130/70         Passed - Valid encounter within last 6 months    Recent Outpatient Visits           2 months ago Essential hypertension   [place] [facility] at [facility], Jesse Sans, MD   6 months ago Annual physical exam   [place] [facility] at [facility], Jesse Sans, MD   10 months ago Type II diabetes mellitus with complication [facility])   [place] [facility] and Sports Medicine at [facility], Jesse Sans, MD   1 year ago Type II diabetes mellitus with complication [facility])   [place] [facility] at [facility], Jesse Sans, MD   1 year ago Type II diabetes  mellitus with complication [facility])   [place] [facility] and [facility] at [facility], Jesse Sans, MD       Future Appointments             In 6 months Army Melia, Jesse Sans, MD [facility] Primary Care and Sports Medicine at [facility], [facility]

## 2022-02-07 NOTE — Telephone Encounter (Signed)
Please review. Not on medication list.  KP

## 2022-02-15 ENCOUNTER — Other Ambulatory Visit: Payer: Self-pay | Admitting: Internal Medicine

## 2022-02-15 DIAGNOSIS — G2581 Restless legs syndrome: Secondary | ICD-10-CM

## 2022-02-16 NOTE — Telephone Encounter (Signed)
Requested Prescriptions  Pending Prescriptions Disp Refills  . gabapentin (NEURONTIN) 300 MG capsule [Pharmacy Med Name: GABAPENTIN 300 MG CAPSULE] 90 capsule 1    Sig: TAKE 1 CAPSULE (300 MG TOTAL) BY MOUTH DAILY. SCIATIC NERVE PAIN- TAKING ONLY AT BEDTIME.     Neurology: Anticonvulsants - gabapentin Passed - 02/15/2022  9:45 AM      Passed - Cr in normal range and within 360 days    Creatinine, Ser  Date Value Ref Range Status  12/07/2021 0.81 0.57 - 1.00 mg/dL Final         Passed - Completed PHQ-2 or PHQ-9 in the last 360 days      Passed - Valid encounter within last 12 months    Recent Outpatient Visits          2 months ago Essential hypertension   [place] [facility] at [facility], Jesse Sans, MD   6 months ago Annual physical exam   [place] [facility] at [facility], Jesse Sans, MD   10 months ago Type II diabetes mellitus with complication [facility])   [place] [facility] and Sports Medicine at [facility], Jesse Sans, MD   1 year ago Type II diabetes mellitus with complication [facility])   [place] [facility] at [facility], Jesse Sans, MD   1 year ago Type II diabetes mellitus with complication [facility])   [place] [facility] and Sports Medicine at [facility], Jesse Sans, MD      Future Appointments            In 5 months Army Melia, Jesse Sans, MD [place] and Sports Medicine at [facility], [facility]

## 2022-02-22 ENCOUNTER — Other Ambulatory Visit: Payer: Self-pay | Admitting: Internal Medicine

## 2022-02-22 DIAGNOSIS — I10 Essential (primary) hypertension: Secondary | ICD-10-CM

## 2022-02-22 DIAGNOSIS — E1169 Type 2 diabetes mellitus with other specified complication: Secondary | ICD-10-CM

## 2022-02-23 NOTE — Telephone Encounter (Signed)
Requested Prescriptions  Pending Prescriptions Disp Refills  . lisinopril-hydrochlorothiazide (ZESTORETIC) 10-12.5 MG tablet [Pharmacy Med Name: LISINOPRIL-HCTZ 10-12.5 MG TAB] 90 tablet 0    Sig: TAKE 1 TABLET BY MOUTH EVERY DAY     Cardiovascular:  ACEI + Diuretic Combos Passed - 02/22/2022  1:01 PM      Passed - Na in normal range and within 180 days    Sodium  Date Value Ref Range Status  12/07/2021 135 134 - 144 mmol/L Final         Passed - K in normal range and within 180 days    Potassium  Date Value Ref Range Status  12/07/2021 4.6 3.5 - 5.2 mmol/L Final         Passed - Cr in normal range and within 180 days    Creatinine, Ser  Date Value Ref Range Status  12/07/2021 0.81 0.57 - 1.00 mg/dL Final         Passed - eGFR is 30 or above and within 180 days    GFR calc Af Amer  Date Value Ref Range Status  08/06/2020 106 >59 mL/min/1.73 Final    Comment:    **In accordance with recommendations from the NKF-ASN Task force,**   [facility] is in the process of updating its eGFR calculation to the   2021 CKD-EPI creatinine equation that estimates kidney function   without a race variable.    GFR calc non Af Amer  Date Value Ref Range Status  08/06/2020 92 >59 mL/min/1.73 Final   eGFR  Date Value Ref Range Status  12/07/2021 79 >59 mL/min/1.73 Final         Passed - Patient is not pregnant      Passed - Last BP in normal range    BP Readings from Last 1 Encounters:  12/07/21 130/70         Passed - Valid encounter within last 6 months    Recent Outpatient Visits          2 months ago Essential hypertension   [place] [facility] at [facility], Nyoka Cowden, MD   6 months ago Annual physical exam   [place] [facility] and [facility] Medicine at [facility], Nyoka Cowden, MD   11 months ago Type II diabetes mellitus with complication [facility])   [place] [facility] and [facility] at [facility], Nyoka Cowden, MD   1 year ago Type II diabetes mellitus with complication [facility])   [place] [facility] Care and Sports Medicine at [facility], Nyoka Cowden, MD   1 year ago Type II diabetes mellitus with complication [facility])   [place] [facility] at [facility], Nyoka Cowden, MD      Future Appointments            In 5 months Judithann Graves, Nyoka Cowden, MD [facility] Primary Care and Sports Medicine at [facility], [facility]           . fenofibrate (TRICOR) 145 MG tablet [Pharmacy Med Name: FENOFIBRATE 145 MG TABLET] 90 tablet 0    Sig: TAKE 1 TABLET BY MOUTH EVERY DAY     Cardiovascular:  Antilipid - Fibric Acid Derivatives Failed - 02/22/2022  1:01 PM      Failed - HCT in normal range and within 360 days    Hematocrit  Date Value Ref Range Status  08/09/2021 47.7 (H) 34.0 - 46.6 % Final  Failed - WBC in normal range and within 360 days    WBC  Date Value Ref Range Status  08/09/2021 11.0 (H) 3.4 - 10.8 x10E3/uL Final         Failed - Lipid Panel in normal range within the last 12 months    Cholesterol, Total  Date Value Ref Range Status  08/09/2021 198 100 - 199 mg/dL Final   LDL Chol Calc (NIH)  Date Value Ref Range Status  08/09/2021 94 0 - 99 mg/dL Final   HDL  Date Value Ref Range Status  08/09/2021 38 (L) >39 mg/dL Final   Triglycerides  Date Value Ref Range Status  08/09/2021 400 (H) 0 - 149 mg/dL Final         Passed - ALT in normal range and within 360 days    ALT  Date Value Ref Range Status  08/09/2021 26 0 - 32 IU/L Final         Passed - AST in normal range and within 360 days    AST  Date Value Ref Range Status  08/09/2021 20 0 - 40 IU/L Final         Passed - Cr in normal range and within 360 days    Creatinine, Ser  Date Value Ref Range Status  12/07/2021 0.81 0.57 - 1.00 mg/dL Final         Passed - HGB in normal range and within 360 days    Hemoglobin  Date Value Ref Range Status  08/09/2021 15.8  11.1 - 15.9 g/dL Final         Passed - PLT in normal range and within 360 days    Platelets  Date Value Ref Range Status  08/09/2021 342 150 - 450 x10E3/uL Final         Passed - eGFR is 30 or above and within 360 days    GFR calc Af Amer  Date Value Ref Range Status  08/06/2020 106 >59 mL/min/1.73 Final    Comment:    **In accordance with recommendations from the NKF-ASN Task force,**   [facility] is in the process of updating its eGFR calculation to the   2021 CKD-EPI creatinine equation that estimates kidney function   without a race variable.    GFR calc non Af Amer  Date Value Ref Range Status  08/06/2020 92 >59 mL/min/1.73 Final   eGFR  Date Value Ref Range Status  12/07/2021 79 >59 mL/min/1.73 Final         Passed - Valid encounter within last 12 months    Recent Outpatient Visits          2 months ago Essential hypertension   [place] [facility] at [facility], Jesse Sans, MD   6 months ago Annual physical exam   [place] [facility] at [facility], Jesse Sans, MD   11 months ago Type II diabetes mellitus with complication [facility])   [place] [facility] at [facility], Jesse Sans, MD   1 year ago Type II diabetes mellitus with complication [facility])   [place] [facility] and Sports Medicine at [facility], Jesse Sans, MD   1 year ago Type II diabetes mellitus with complication [facility])   [place] [facility] and Sports Medicine at [facility], Jesse Sans, MD      Future Appointments  In 5 months Army Melia, Jesse Sans, MD [place] [facility] and Sports Medicine at [facility], [facility]

## 2022-02-23 NOTE — Telephone Encounter (Signed)
Requested by interface surescripts. Requesting too soon. Last refill 01/20/22.  Requested Prescriptions  Signed Prescriptions Disp Refills   fenofibrate (TRICOR) 145 MG tablet 90 tablet 0    Sig: TAKE 1 TABLET BY MOUTH EVERY DAY     Cardiovascular:  Antilipid - Fibric Acid Derivatives Failed - 02/22/2022  1:01 PM      Failed - HCT in normal range and within 360 days    Hematocrit  Date Value Ref Range Status  08/09/2021 47.7 (H) 34.0 - 46.6 % Final         Failed - WBC in normal range and within 360 days    WBC  Date Value Ref Range Status  08/09/2021 11.0 (H) 3.4 - 10.8 x10E3/uL Final         Failed - Lipid Panel in normal range within the last 12 months    Cholesterol, Total  Date Value Ref Range Status  08/09/2021 198 100 - 199 mg/dL Final   LDL Chol Calc (NIH)  Date Value Ref Range Status  08/09/2021 94 0 - 99 mg/dL Final   HDL  Date Value Ref Range Status  08/09/2021 38 (L) >39 mg/dL Final   Triglycerides  Date Value Ref Range Status  08/09/2021 400 (H) 0 - 149 mg/dL Final         Passed - ALT in normal range and within 360 days    ALT  Date Value Ref Range Status  08/09/2021 26 0 - 32 IU/L Final         Passed - AST in normal range and within 360 days    AST  Date Value Ref Range Status  08/09/2021 20 0 - 40 IU/L Final         Passed - Cr in normal range and within 360 days    Creatinine, Ser  Date Value Ref Range Status  12/07/2021 0.81 0.57 - 1.00 mg/dL Final         Passed - HGB in normal range and within 360 days    Hemoglobin  Date Value Ref Range Status  08/09/2021 15.8 11.1 - 15.9 g/dL Final         Passed - PLT in normal range and within 360 days    Platelets  Date Value Ref Range Status  08/09/2021 342 150 - 450 x10E3/uL Final         Passed - eGFR is 30 or above and within 360 days    GFR calc Af Amer  Date Value Ref Range Status  08/06/2020 106 >59 mL/min/1.73 Final    Comment:    **In accordance with recommendations from the  NKF-ASN Task force,**   [facility] is in the process of updating its eGFR calculation to the   2021 CKD-EPI creatinine equation that estimates kidney function   without a race variable.    GFR calc non Af Amer  Date Value Ref Range Status  08/06/2020 92 >59 mL/min/1.73 Final   eGFR  Date Value Ref Range Status  12/07/2021 79 >59 mL/min/1.73 Final         Passed - Valid encounter within last 12 months    Recent Outpatient Visits          2 months ago Essential hypertension   [place] [facility] at [facility], Jesse Sans, MD   6 months ago Annual physical exam   [facility], Jesse Sans, MD   [phone]  months ago Type II diabetes mellitus with complication [facility])   [place] [facility] at [facility], Jesse Sans, MD   1 year ago Type II diabetes mellitus with complication [facility])   [place] [facility] and Sports Medicine at [facility], Jesse Sans, MD   1 year ago Type II diabetes mellitus with complication [facility])   [place] [facility] and Sports Medicine at [facility], Jesse Sans, MD      Future Appointments            In 5 months Army Melia, Jesse Sans, MD [facility] Primary Care and Sports Medicine at [facility], [place]  . lisinopril-hydrochlorothiazide (ZESTORETIC) 10-12.5 MG tablet [Pharmacy Med Name: LISINOPRIL-HCTZ 10-12.5 MG TAB] 90 tablet 0    Sig: TAKE 1 TABLET BY MOUTH EVERY DAY     Cardiovascular:  ACEI + Diuretic Combos Passed - 02/22/2022  1:01 PM      Passed - Na in normal range and within 180 days    Sodium  Date Value Ref Range Status  12/07/2021 135 134 - 144 mmol/L Final         Passed - K in normal range and within 180 days    Potassium  Date Value Ref Range Status  12/07/2021 4.6 3.5 - 5.2 mmol/L Final         Passed - Cr in normal range and within 180  days    Creatinine, Ser  Date Value Ref Range Status  12/07/2021 0.81 0.57 - 1.00 mg/dL Final         Passed - eGFR is 30 or above and within 180 days    GFR calc Af Amer  Date Value Ref Range Status  08/06/2020 106 >59 mL/min/1.73 Final    Comment:    **In accordance with recommendations from the NKF-ASN Task force,**   [facility] is in the process of updating its eGFR calculation to the   2021 CKD-EPI creatinine equation that estimates kidney function   without a race variable.    GFR calc non Af Amer  Date Value Ref Range Status  08/06/2020 92 >59 mL/min/1.73 Final   eGFR  Date Value Ref Range Status  12/07/2021 79 >59 mL/min/1.73 Final         Passed - Patient is not pregnant      Passed - Last BP in normal range    BP Readings from Last 1 Encounters:  12/07/21 130/70         Passed - Valid encounter within last 6 months    Recent Outpatient Visits          2 months ago Essential hypertension   [place] [facility] at [facility], Jesse Sans, MD   6 months ago Annual physical exam   [place] [facility] at [facility], Jesse Sans, MD   11 months ago Type II diabetes mellitus with complication [facility])   [place] [facility] at [facility], Jesse Sans, MD   1 year ago Type II diabetes mellitus with complication [facility])   [place] [facility] Care and Sports Medicine at [facility], Jesse Sans, MD   1 year ago Type II diabetes mellitus with complication [facility])   [place] [facility] Care and Sports Medicine at [facility], Jesse Sans, MD  Future Appointments            In 5 months Army Melia, Jesse Sans, MD [place] and Sports Medicine at [facility], [facility]

## 2022-03-29 ENCOUNTER — Other Ambulatory Visit: Payer: Self-pay | Admitting: Internal Medicine

## 2022-03-29 DIAGNOSIS — I10 Essential (primary) hypertension: Secondary | ICD-10-CM

## 2022-03-29 NOTE — Telephone Encounter (Signed)
Requested Prescriptions  Pending Prescriptions Disp Refills  . metoprolol succinate (TOPROL-XL) 100 MG 24 hr tablet [Pharmacy Med Name: METOPROLOL SUCC ER 100 MG TAB] 90 tablet 0    Sig: TAKE 1 TABLET (100 MG TOTAL) BY MOUTH EVERY EVENING. TAKE WITH OR IMMEDIATELY FOLLOWING A MEAL.     Cardiovascular:  Beta Blockers Passed - 03/29/2022  2:31 PM      Passed - Last BP in normal range    BP Readings from Last 1 Encounters:  12/07/21 130/70         Passed - Last Heart Rate in normal range    Pulse Readings from Last 1 Encounters:  12/07/21 81         Passed - Valid encounter within last 6 months    Recent Outpatient Visits          3 months ago Essential hypertension   [place] [facility] at [facility], Jesse Sans, MD   7 months ago Annual physical exam   [place] [facility] and Sports [facility] at [facility], Jesse Sans, MD   1 year ago Type II diabetes mellitus with complication [facility])   [place] [facility] Care and Sports Medicine at [facility], Jesse Sans, MD   1 year ago Type II diabetes mellitus with complication [facility])    Primary Care and Sports Medicine at [facility], Jesse Sans, MD   1 year ago Type II diabetes mellitus with complication [facility])    Primary Care and Sports Medicine at [facility], Jesse Sans, MD      Future Appointments            In 4 months Army Melia, Jesse Sans, MD [place] and Sports Medicine at [facility], [facility]

## 2022-04-09 ENCOUNTER — Other Ambulatory Visit: Payer: Self-pay | Admitting: Internal Medicine

## 2022-04-09 DIAGNOSIS — G2581 Restless legs syndrome: Secondary | ICD-10-CM

## 2022-04-10 NOTE — Telephone Encounter (Signed)
Requested medications are due for refill today.  yes  Requested medications are on the active medications list.  yes  Last refill. 02/01/2022 #30 1 rf  Future visit scheduled.   yes  Notes to clinic.  Refill not delegated    Requested Prescriptions  Pending Prescriptions Disp Refills   cyclobenzaprine (FLEXERIL) 10 MG tablet [Pharmacy Med Name: CYCLOBENZAPRINE 10 MG TABLET] 30 tablet 1    Sig: TAKE 1 TABLET (10 MG TOTAL) BY MOUTH AT BEDTIME. RESTLESS LEG- USUALLY AT BEDTIME.     Not Delegated - Analgesics:  Muscle Relaxants Failed - 04/09/2022  9:44 AM      Failed - This refill cannot be delegated      Passed - Valid encounter within last 6 months    Recent Outpatient Visits           4 months ago Essential hypertension   [place] [facility] at [facility], Jesse Sans, MD   8 months ago Annual physical exam   [place] [facility] and Sports Medicine at [facility], Jesse Sans, MD   1 year ago Type II diabetes mellitus with complication [facility])   [place] [facility] and Sports Medicine at [facility], Jesse Sans, MD   1 year ago Type II diabetes mellitus with complication [facility])   [place] Primary Care and Sports Medicine at [facility], Jesse Sans, MD   1 year ago Type II diabetes mellitus with complication [facility])   [place] Primary Care and Sports Medicine at [facility], Jesse Sans, MD       Future Appointments             In 4 months Army Melia, Jesse Sans, MD [place] and Sports Medicine at [facility], [facility]

## 2022-04-13 ENCOUNTER — Other Ambulatory Visit: Payer: Self-pay | Admitting: Internal Medicine

## 2022-04-13 DIAGNOSIS — E1169 Type 2 diabetes mellitus with other specified complication: Secondary | ICD-10-CM

## 2022-04-13 NOTE — Telephone Encounter (Signed)
Requested Prescriptions  Pending Prescriptions Disp Refills   fenofibrate (TRICOR) 145 MG tablet [Pharmacy Med Name: FENOFIBRATE 145 MG TABLET] 90 tablet 0    Sig: TAKE 1 TABLET BY MOUTH EVERY DAY     Cardiovascular:  Antilipid - Fibric Acid Derivatives Failed - 04/13/2022 10:48 AM      Failed - HCT in normal range and within 360 days    Hematocrit  Date Value Ref Range Status  08/09/2021 47.7 (H) 34.0 - 46.6 % Final         Failed - WBC in normal range and within 360 days    WBC  Date Value Ref Range Status  08/09/2021 11.0 (H) 3.4 - 10.8 x10E3/uL Final         Failed - Lipid Panel in normal range within the last 12 months    Cholesterol, Total  Date Value Ref Range Status  08/09/2021 198 100 - 199 mg/dL Final   LDL Chol Calc (NIH)  Date Value Ref Range Status  08/09/2021 94 0 - 99 mg/dL Final   HDL  Date Value Ref Range Status  08/09/2021 38 (L) >39 mg/dL Final   Triglycerides  Date Value Ref Range Status  08/09/2021 400 (H) 0 - 149 mg/dL Final         Passed - ALT in normal range and within 360 days    ALT  Date Value Ref Range Status  08/09/2021 26 0 - 32 IU/L Final         Passed - AST in normal range and within 360 days    AST  Date Value Ref Range Status  08/09/2021 20 0 - 40 IU/L Final         Passed - Cr in normal range and within 360 days    Creatinine, Ser  Date Value Ref Range Status  12/07/2021 0.81 0.57 - 1.00 mg/dL Final         Passed - HGB in normal range and within 360 days    Hemoglobin  Date Value Ref Range Status  08/09/2021 15.8 11.1 - 15.9 g/dL Final         Passed - PLT in normal range and within 360 days    Platelets  Date Value Ref Range Status  08/09/2021 342 150 - 450 x10E3/uL Final         Passed - eGFR is 30 or above and within 360 days    GFR calc Af Amer  Date Value Ref Range Status  08/06/2020 106 >59 mL/min/1.73 Final    Comment:    **In accordance with recommendations from the NKF-ASN Task force,**   [facility]  is in the process of updating its eGFR calculation to the   2021 CKD-EPI creatinine equation that estimates kidney function   without a race variable.    GFR calc non Af Amer  Date Value Ref Range Status  08/06/2020 92 >59 mL/min/1.73 Final   eGFR  Date Value Ref Range Status  12/07/2021 79 >59 mL/min/1.73 Final         Passed - Valid encounter within last 12 months    Recent Outpatient Visits           4 months ago Essential hypertension   [place] [facility] at [facility], Jesse Sans, MD   8 months ago Annual physical exam   [facility] at [facility], Jesse Sans, MD   1 year ago Type II  diabetes mellitus with complication [facility])   [place] [facility] at [facility], Jesse Sans, MD   1 year ago Type II diabetes mellitus with complication [facility])   [place] [facility] at [facility], Jesse Sans, MD   1 year ago Type II diabetes mellitus with complication [facility])   [place] [facility] at [facility], Jesse Sans, MD       Future Appointments             In 3 months Army Melia, Jesse Sans, MD [place] [facility] at [facility]

## 2022-04-27 DIAGNOSIS — L409 Psoriasis, unspecified: Secondary | ICD-10-CM | POA: Diagnosis not present

## 2022-04-27 DIAGNOSIS — L4050 Arthropathic psoriasis, unspecified: Secondary | ICD-10-CM | POA: Diagnosis not present

## 2022-04-27 DIAGNOSIS — Z79899 Other long term (current) drug therapy: Secondary | ICD-10-CM | POA: Diagnosis not present

## 2022-05-26 ENCOUNTER — Other Ambulatory Visit: Payer: Self-pay | Admitting: Internal Medicine

## 2022-05-26 DIAGNOSIS — I10 Essential (primary) hypertension: Secondary | ICD-10-CM

## 2022-05-26 NOTE — Telephone Encounter (Signed)
Requested Prescriptions  Pending Prescriptions Disp Refills   lisinopril-hydrochlorothiazide (ZESTORETIC) 10-12.5 MG tablet [Pharmacy Med Name: LISINOPRIL-HCTZ 10-12.5 MG TAB] 90 tablet 0    Sig: TAKE 1 TABLET BY MOUTH EVERY DAY     Cardiovascular:  ACEI + Diuretic Combos Passed - 05/26/2022  9:57 AM      Passed - Na in normal range and within 180 days    Sodium  Date Value Ref Range Status  12/07/2021 135 134 - 144 mmol/L Final         Passed - K in normal range and within 180 days    Potassium  Date Value Ref Range Status  12/07/2021 4.6 3.5 - 5.2 mmol/L Final         Passed - Cr in normal range and within 180 days    Creatinine, Ser  Date Value Ref Range Status  12/07/2021 0.81 0.57 - 1.00 mg/dL Final         Passed - eGFR is 30 or above and within 180 days    GFR calc Af Amer  Date Value Ref Range Status  08/06/2020 106 >59 mL/min/1.73 Final    Comment:    **In accordance with recommendations from the NKF-ASN Task force,**   [facility] is in the process of updating its eGFR calculation to the   2021 CKD-EPI creatinine equation that estimates kidney function   without a race variable.    GFR calc non Af Amer  Date Value Ref Range Status  08/06/2020 92 >59 mL/min/1.73 Final   eGFR  Date Value Ref Range Status  12/07/2021 79 >59 mL/min/1.73 Final         Passed - Patient is not pregnant      Passed - Last BP in normal range    BP Readings from Last 1 Encounters:  12/07/21 130/70         Passed - Valid encounter within last 6 months    Recent Outpatient Visits           5 months ago Essential hypertension   [place] [facility] at [facility], Jesse Sans, MD   9 months ago Annual physical exam   [place] [facility] at [facility], Jesse Sans, MD   1 year ago Type II diabetes mellitus with complication [facility])   [place] [facility] at [facility], Jesse Sans, MD   1 year ago Type II diabetes mellitus with complication [facility])   [place] [facility] at [facility], Jesse Sans, MD   1 year ago Type II diabetes mellitus with complication [facility])   [place] [facility] at [facility], Jesse Sans, MD       Future Appointments             In 2 months Army Melia, Jesse Sans, MD [place] and Sports Medicine at [facility]

## 2022-06-09 ENCOUNTER — Other Ambulatory Visit: Payer: Self-pay | Admitting: Internal Medicine

## 2022-06-09 DIAGNOSIS — G2581 Restless legs syndrome: Secondary | ICD-10-CM

## 2022-06-25 ENCOUNTER — Other Ambulatory Visit: Payer: Self-pay | Admitting: Internal Medicine

## 2022-06-25 DIAGNOSIS — I10 Essential (primary) hypertension: Secondary | ICD-10-CM

## 2022-07-24 ENCOUNTER — Other Ambulatory Visit: Payer: Self-pay | Admitting: Internal Medicine

## 2022-07-24 DIAGNOSIS — E118 Type 2 diabetes mellitus with unspecified complications: Secondary | ICD-10-CM

## 2022-07-24 DIAGNOSIS — I10 Essential (primary) hypertension: Secondary | ICD-10-CM

## 2022-07-24 DIAGNOSIS — E1169 Type 2 diabetes mellitus with other specified complication: Secondary | ICD-10-CM

## 2022-07-24 DIAGNOSIS — H8113 Benign paroxysmal vertigo, bilateral: Secondary | ICD-10-CM

## 2022-07-24 DIAGNOSIS — G2581 Restless legs syndrome: Secondary | ICD-10-CM

## 2022-08-08 ENCOUNTER — Other Ambulatory Visit: Payer: Self-pay | Admitting: Internal Medicine

## 2022-08-08 DIAGNOSIS — G2581 Restless legs syndrome: Secondary | ICD-10-CM

## 2022-08-09 ENCOUNTER — Ambulatory Visit (INDEPENDENT_AMBULATORY_CARE_PROVIDER_SITE_OTHER): Payer: Medicare HMO | Admitting: Internal Medicine

## 2022-08-09 ENCOUNTER — Encounter: Payer: Self-pay | Admitting: Internal Medicine

## 2022-08-09 VITALS — BP 122/76 | HR 69 | Ht 67.0 in | Wt 202.0 lb

## 2022-08-09 DIAGNOSIS — E1169 Type 2 diabetes mellitus with other specified complication: Secondary | ICD-10-CM

## 2022-08-09 DIAGNOSIS — L409 Psoriasis, unspecified: Secondary | ICD-10-CM | POA: Diagnosis not present

## 2022-08-09 DIAGNOSIS — I10 Essential (primary) hypertension: Secondary | ICD-10-CM

## 2022-08-09 DIAGNOSIS — E118 Type 2 diabetes mellitus with unspecified complications: Secondary | ICD-10-CM

## 2022-08-09 DIAGNOSIS — E785 Hyperlipidemia, unspecified: Secondary | ICD-10-CM

## 2022-08-09 DIAGNOSIS — Z1231 Encounter for screening mammogram for malignant neoplasm of breast: Secondary | ICD-10-CM | POA: Diagnosis not present

## 2022-08-09 DIAGNOSIS — R0609 Other forms of dyspnea: Secondary | ICD-10-CM

## 2022-08-09 DIAGNOSIS — L4050 Arthropathic psoriasis, unspecified: Secondary | ICD-10-CM

## 2022-08-09 DIAGNOSIS — Z1211 Encounter for screening for malignant neoplasm of colon: Secondary | ICD-10-CM

## 2022-08-09 DIAGNOSIS — F39 Unspecified mood [affective] disorder: Secondary | ICD-10-CM

## 2022-08-09 DIAGNOSIS — Z0000 Encounter for general adult medical examination without abnormal findings: Secondary | ICD-10-CM | POA: Diagnosis not present

## 2022-08-09 DIAGNOSIS — R69 Illness, unspecified: Secondary | ICD-10-CM | POA: Diagnosis not present

## 2022-08-09 DIAGNOSIS — D485 Neoplasm of uncertain behavior of skin: Secondary | ICD-10-CM | POA: Diagnosis not present

## 2022-08-09 MED ORDER — ALBUTEROL SULFATE HFA 108 (90 BASE) MCG/ACT IN AERS: INHALATION_SPRAY | RESPIRATORY_TRACT | 2 refills | Status: AC

## 2022-08-09 NOTE — Progress Notes (Signed)
Date:  08/09/2022   Name:  Diane Morris   DOB:  [birth year]   MRN:  [id number]   Chief Complaint: Annual Exam Diane Morris is a 72 y.o. female who presents today for her Complete Annual Exam. She feels well. She reports exercising - none. She reports she is sleeping well. Breast complaints - none.  She continues to gradually lose weight - has less appetite.  Mammogram: 11/2021 DEXA: 08/2020 Colonoscopy: 09/2016 repeat 5 yrs for TA  Health Maintenance Due  Topic Date Due   DTaP/Tdap/Td (1 - Tdap) Never done   Zoster Vaccines- Shingrix (1 of 2) Never done   OPHTHALMOLOGY EXAM  02/21/2019   HEMOGLOBIN A1C  06/08/2022    Immunization History  Administered Date(s) Administered   Fluad Quad(high Dose 65+) 03/10/2020, 03/24/2021   Moderna Sars-Covid-2 Vaccination 09/24/2019, 10/22/2019, 04/26/2020   PNEUMOCOCCAL CONJUGATE-20 08/09/2021   Pneumococcal Conjugate-13 08/06/2020    Hypertension This is a chronic problem. The problem is controlled. Pertinent negatives include no chest pain, headaches, palpitations or shortness of breath. Past treatments include ACE inhibitors, beta blockers and diuretics. The current treatment provides significant improvement. There are no compliance problems.  There is no history of kidney disease, CAD/MI or CVA.  Diabetes She presents for her follow-up diabetic visit. She has type 2 diabetes mellitus. Her disease course has been stable. Pertinent negatives for hypoglycemia include no dizziness, headaches, nervousness/anxiousness or tremors. Pertinent negatives for diabetes include no chest pain, no fatigue, no polydipsia and no polyuria. Pertinent negatives for diabetic complications include no CVA. Current diabetic treatment includes oral agent (monotherapy) Wilder Glade). An ACE inhibitor/angiotensin II receptor blocker is being taken. Eye exam is not current.  Hyperlipidemia This is a chronic problem. The problem is uncontrolled. Recent lipid tests were  reviewed and are high. Pertinent negatives include no chest pain or shortness of breath. Current antihyperlipidemic treatment includes statins and fibric acid derivatives. The current treatment provides moderate improvement of lipids.    Lab Results  Component Value Date   NA 135 12/07/2021   K 4.6 12/07/2021   CO2 20 12/07/2021   GLUCOSE 117 (H) 12/07/2021   BUN 14 12/07/2021   CREATININE 0.81 12/07/2021   CALCIUM 10.9 (H) 12/07/2021   EGFR 79 12/07/2021   GFRNONAA 92 08/06/2020   Lab Results  Component Value Date   CHOL 198 08/09/2021   HDL 38 (L) 08/09/2021   LDLCALC 94 08/09/2021   TRIG 400 (H) 08/09/2021   CHOLHDL 5.2 (H) 08/09/2021   Lab Results  Component Value Date   TSH 2.300 08/09/2021   Lab Results  Component Value Date   HGBA1C 6.1 (H) 12/07/2021   Lab Results  Component Value Date   WBC 11.0 (H) 08/09/2021   HGB 15.8 08/09/2021   HCT 47.7 (H) 08/09/2021   MCV 86 08/09/2021   PLT 342 08/09/2021   Lab Results  Component Value Date   ALT 26 08/09/2021   AST 20 08/09/2021   ALKPHOS 181 (H) 08/09/2021   BILITOT 0.5 08/09/2021   Lab Results  Component Value Date   25OHVITD2 <1.0 02/14/2017   25OHVITD3 11 02/14/2017     Review of Systems  Constitutional:  Negative for chills, fatigue and fever.  HENT:  Negative for congestion, hearing loss, tinnitus, trouble swallowing and voice change.   Eyes:  Negative for visual disturbance.  Respiratory:  Negative for cough, chest tightness, shortness of breath and wheezing.   Cardiovascular:  Negative for chest pain, palpitations and leg swelling.  Gastrointestinal:  Negative for abdominal pain, constipation, diarrhea and vomiting.  Endocrine: Negative for polydipsia and polyuria.  Genitourinary:  Negative for dysuria, frequency, genital sores, vaginal bleeding and vaginal discharge.  Musculoskeletal:  Negative for arthralgias, gait problem and joint swelling.  Skin:  Negative for color change and rash.   Neurological:  Negative for dizziness, tremors, light-headedness and headaches.  Hematological:  Negative for adenopathy. Does not bruise/bleed easily.  Psychiatric/Behavioral:  Negative for dysphoric mood and sleep disturbance. The patient is not nervous/anxious.     Patient Active Problem List   Diagnosis Date Noted   Mood disorder ([place]) 12/08/2019   Hepatic steatosis 12/07/2019   Elevated alkaline phosphatase level 08/08/2019   Hyperlipidemia associated with type 2 diabetes mellitus ([place]) 08/06/2019   Essential hypertension 01/31/2019   GERD without esophagitis 01/31/2019   Type II diabetes mellitus with complication (HCC) 0000000   Benign paroxysmal positional vertigo due to bilateral vestibular disorder 01/31/2019   History of gastritis    Vitamin D deficiency 02/19/2017   Chronic low back pain (Primary Area of Pain) (L>R) 02/14/2017   Chronic hip pain (Secondary Area of Pain) (L>R) 02/14/2017   Chronic right shoulder pain (Tertiary Area of Pain) 02/14/2017   Bilateral chronic knee pain (Fourth Area of Pain) (L>R) 02/14/2017   Chronic neck pain 02/14/2017   Chronic upper extremity pain 02/14/2017   Chronic sacroiliac joint pain 02/14/2017   Adenomatous polyp of colon    CTS (carpal tunnel syndrome) 08/28/2016   Hepatitis B surface antigen positive 11/19/2015   Enthesitis 11/19/2015   Facet joint disease of lumbosacral region ([place]) 11/19/2015   Chronic bilateral low back pain with left-sided sciatica 11/09/2015   Psoriasis 11/09/2015   Psoriatic arthritis ([place]) 11/09/2015   Tendinitis of right foot 11/09/2015    Allergies  Allergen Reactions   Contrast Media [Iodinated Contrast Media] Shortness Of Breath   Bee Venom Swelling   Codeine Nausea And Vomiting        Shellfish Allergy Nausea And Vomiting, Swelling and Rash        Sodium Clavulanate Diarrhea    Past Surgical History:  Procedure Laterality Date   COLONOSCOPY     COLONOSCOPY WITH PROPOFOL N/A  10/09/2016   Procedure: COLONOSCOPY WITH PROPOFOL;  Surgeon: [name], MD;  Location: [place];  Service: Endoscopy;  Laterality: N/A;  Diabetic - oral meds   DILATION AND CURETTAGE OF UTERUS     ESOPHAGOGASTRODUODENOSCOPY (EGD) WITH PROPOFOL N/A 01/03/2018   Procedure: ESOPHAGOGASTRODUODENOSCOPY (EGD) WITH PROPOFOL;  Surgeon: [name], MD;  Location: [employer];  Service: Endoscopy;  Laterality: N/A;  Diabetic - oral meds   INDUCED ABORTION     POLYPECTOMY  10/09/2016   Procedure: POLYPECTOMY;  Surgeon: [name], MD;  Location: [place];  Service: Endoscopy;;   TUBAL LIGATION      Social History   Tobacco Use   Smoking status: Former    Packs/day: 1.00    Years: 18.00    Total pack years: 18.00    Types: Cigarettes    Quit date: 06/12/2006    Years since quitting: 16.1   Smokeless tobacco: Never  Vaping Use   Vaping Use: Never used  Substance Use Topics   Alcohol use: No   Drug use: No     Medication list has been reviewed and updated.  Current Meds  Medication Sig   atorvastatin (LIPITOR) 80 MG tablet TAKE 1 TABLET BY MOUTH EVERY DAY   Cetirizine HCl (ALLERGY, CETIRIZINE,  PO) Take by mouth.   cyclobenzaprine (FLEXERIL) 10 MG tablet TAKE 1 TABLET (10 MG TOTAL) BY MOUTH AT BEDTIME. RESTLESS LEG- USUALLY AT BEDTIME.   dapagliflozin propanediol (FARXIGA) 10 MG TABS tablet TAKE 1 TABLET BY MOUTH EVERY DAY   fenofibrate (TRICOR) 145 MG tablet TAKE 1 TABLET BY MOUTH EVERY DAY   gabapentin (NEURONTIN) 300 MG capsule TAKE 1 CAPSULE (300 MG TOTAL) BY MOUTH DAILY. SCIATIC NERVE PAIN- TAKING ONLY AT BEDTIME.   Ibuprofen 200 MG CAPS Take by mouth.   lisinopril-hydrochlorothiazide (ZESTORETIC) 10-12.5 MG tablet TAKE 1 TABLET BY MOUTH EVERY DAY   meclizine (ANTIVERT) 25 MG tablet Take 12.5 mg by mouth daily. OTC daily.   metoprolol succinate (TOPROL-XL) 100 MG 24 hr tablet TAKE 1 TABLET (100 MG TOTAL) BY MOUTH EVERY EVENING. TAKE WITH OR IMMEDIATELY  FOLLOWING A MEAL.   omeprazole (PRILOSEC) 20 MG capsule Take 20 mg by mouth daily. OTC   OTEZLA 30 MG TABS Take 1 tablet by mouth daily.   venlafaxine XR (EFFEXOR-XR) 37.5 MG 24 hr capsule TAKE 1 CAPSULE BY MOUTH DAILY WITH BREAKFAST.   [DISCONTINUED] VENTOLIN HFA 108 (90 Base) MCG/ACT inhaler TAKE 2 PUFFS BY MOUTH EVERY 6 HOURS AS NEEDED FOR WHEEZE OR SHORTNESS OF BREATH       08/09/2022   10:01 AM 12/07/2021    9:37 AM 08/09/2021   10:39 AM 03/24/2021   10:31 AM  GAD 7 : Generalized Anxiety Score  Nervous, Anxious, on Edge 1 0 0 0  Control/stop worrying 0 0 0 0  Worry too much - different things 0 0 0 0  Trouble relaxing 0 0 0 0  Restless 0 0 0 0  Easily annoyed or irritable 0 0 0 0  Afraid - awful might happen 0 0 0 0  Total GAD 7 Score 1 0 0 0  Anxiety Difficulty Not difficult at all Not difficult at all Not difficult at all        08/09/2022   10:01 AM 12/07/2021    9:37 AM 11/14/2021   11:43 AM  Depression screen PHQ 2/9  Decreased Interest 0 0 0  Down, Depressed, Hopeless 0 0 0  PHQ - 2 Score 0 0 0  Altered sleeping 0 0   Tired, decreased energy 0 0   Change in appetite 3 0   Feeling bad or failure about yourself  0 0   Trouble concentrating 0 0   Moving slowly or fidgety/restless 0 0   Suicidal thoughts 0 0   PHQ-9 Score 3 0   Difficult doing work/chores Not difficult at all Not difficult at all     BP Readings from Last 3 Encounters:  08/09/22 122/76  12/07/21 130/70  08/09/21 122/80    Physical Exam Vitals and nursing note reviewed.  Constitutional:      General: She is not in acute distress.    Appearance: She is well-developed.  HENT:     Head: Normocephalic and atraumatic.     Right Ear: Tympanic membrane and ear canal normal.     Left Ear: Tympanic membrane and ear canal normal.     Nose:     Right Sinus: No maxillary sinus tenderness.     Left Sinus: No maxillary sinus tenderness.  Eyes:     General: No scleral icterus.       Right eye: No  discharge.        Left eye: No discharge.     Conjunctiva/sclera: Conjunctivae normal.  Neck:  Thyroid: No thyromegaly.     Vascular: No carotid bruit.  Cardiovascular:     Rate and Rhythm: Normal rate and regular rhythm.     Pulses: Normal pulses.     Heart sounds: Normal heart sounds.  Pulmonary:     Effort: Pulmonary effort is normal. No respiratory distress.     Breath sounds: No wheezing.  Chest:  Breasts:    Right: No mass, nipple discharge, skin change or tenderness.     Left: No mass, nipple discharge, skin change or tenderness.  Abdominal:     General: Bowel sounds are normal.     Palpations: Abdomen is soft.     Tenderness: There is no abdominal tenderness.  Musculoskeletal:     Cervical back: Normal range of motion. No erythema.     Right lower leg: No edema.     Left lower leg: No edema.  Lymphadenopathy:     Cervical: No cervical adenopathy.  Skin:    General: Skin is warm and dry.     Findings: No rash.  Neurological:     Mental Status: She is alert and oriented to person, place, and time.     Cranial Nerves: No cranial nerve deficit.     Sensory: No sensory deficit.     Deep Tendon Reflexes: Reflexes are normal and symmetric.  Psychiatric:        Attention and Perception: Attention normal.        Mood and Affect: Mood normal.    Diabetic Foot Exam - Simple   Simple Foot Form Diabetic Foot exam was performed with the following findings: Yes 08/09/2022 10:14 AM  Visual Inspection No deformities, no ulcerations, no other skin breakdown bilaterally: Yes Sensation Testing Intact to touch and monofilament testing bilaterally: Yes Pulse Check Posterior Tibialis and Dorsalis pulse intact bilaterally: Yes Comments Irregular flat brown macule on left middle toe - new      Wt Readings from Last 3 Encounters:  08/09/22 202 lb (91.6 kg)  12/07/21 233 lb (105.7 kg)  08/18/21 247 lb (112 kg)    BP 122/76   Pulse 69   Ht '5\' 7"'$  (1.702 m)   Wt 202 lb  (91.6 kg)   SpO2 96%   BMI 31.64 kg/m   Assessment and Plan: Problem List Items Addressed This Visit       Cardiovascular and Mediastinum   Essential hypertension (Chronic)    Clinically stable exam with well controlled BP on lisinopril and metorprolol. Tolerating medications without side effects. Pt to continue current regimen and low sodium diet.       Relevant Orders   CBC with Differential/Platelet   Comprehensive metabolic panel   TSH     Endocrine   Hyperlipidemia associated with type 2 diabetes mellitus ([place]) (Chronic)    Tolerating statin medications without concerns.  Also on fenofibrate. LDL is  Lab Results  Component Value Date   LDLCALC 94 08/09/2021  with a goal of < 70. Current dose will be adjusted if needed.       Relevant Orders   Comprehensive metabolic panel   Lipid panel   Type II diabetes mellitus with complication (HCC) (Chronic)    Clinically stable without s/s of hypoglycemia. Tolerating Wilder Glade well without side effects or other concerns. Lab Results  Component Value Date   HGBA1C 6.1 (H) 12/07/2021        Relevant Orders   Comprehensive metabolic panel   Hemoglobin A1c   Microalbumin / creatinine urine ratio  Musculoskeletal and Integument   Psoriasis (Chronic)    On Otezla and doing well except for gradual ongoing weight loss Rash is well controlled Arthritis is stable      Psoriatic arthritis (HCC) (Chronic)     Other   Mood disorder (HCC) (Chronic)    Clinically stable on current regimen with good control of symptoms, No SI or HI. No change in management at this time.       Other Visit Diagnoses     Annual physical exam    -  Primary   Normal exam except for weight which is decreasing continue healthy diet up to date on screening/immunization   Encounter for screening mammogram for breast cancer       Relevant Orders   MM 3D SCREEN BREAST BILATERAL   Colon cancer screening       due for 5 yr Colonoscopy    Relevant Orders   Ambulatory referral to Gastroenterology   Other form of dyspnea       Relevant Medications   albuterol (VENTOLIN HFA) 108 (90 Base) MCG/ACT inhaler   Neoplasm of uncertain behavior of skin of foot       Relevant Orders   Ambulatory referral to Dermatology        Partially dictated using Dragon software. Any errors are unintentional.  Halina Maidens, MD [facility]  08/09/2022

## 2022-08-09 NOTE — Assessment & Plan Note (Signed)
On [place] Nail and doing well except for gradual ongoing weight loss Rash is well controlled Arthritis is stable

## 2022-08-09 NOTE — Assessment & Plan Note (Signed)
Clinically stable without s/s of hypoglycemia. Tolerating Wilder Glade well without side effects or other concerns. Lab Results  Component Value Date   HGBA1C 6.1 (H) 12/07/2021

## 2022-08-09 NOTE — Patient Instructions (Signed)
Call [facility] to schedule your mammogram at [phone].  

## 2022-08-09 NOTE — Assessment & Plan Note (Signed)
Clinically stable exam with well controlled BP on lisinopril and metorprolol. Tolerating medications without side effects. Pt to continue current regimen and low sodium diet.

## 2022-08-09 NOTE — Assessment & Plan Note (Signed)
Clinically stable on current regimen with good control of symptoms, No SI or HI. No change in management at this time.

## 2022-08-09 NOTE — Assessment & Plan Note (Signed)
Tolerating statin medications without concerns.  Also on fenofibrate. LDL is  Lab Results  Component Value Date   LDLCALC 94 08/09/2021   with a goal of < 70. Current dose will be adjusted if needed.

## 2022-08-11 DIAGNOSIS — H43813 Vitreous degeneration, bilateral: Secondary | ICD-10-CM | POA: Diagnosis not present

## 2022-08-11 DIAGNOSIS — H2513 Age-related nuclear cataract, bilateral: Secondary | ICD-10-CM | POA: Diagnosis not present

## 2022-08-11 DIAGNOSIS — E119 Type 2 diabetes mellitus without complications: Secondary | ICD-10-CM | POA: Diagnosis not present

## 2022-08-11 LAB — COMPREHENSIVE METABOLIC PANEL
ALT: 19 IU/L (ref 0–32)
AST: 20 IU/L (ref 0–40)
Albumin/Globulin Ratio: 2.4 — ABNORMAL HIGH (ref 1.2–2.2)
Albumin: 4.8 g/dL (ref 3.9–4.9)
Alkaline Phosphatase: 108 IU/L (ref 44–121)
BUN/Creatinine Ratio: 14 (ref 12–28)
BUN: 12 mg/dL (ref 8–27)
Bilirubin Total: 0.3 mg/dL (ref 0.0–1.2)
CO2: 22 mmol/L (ref 20–29)
Calcium: 10.1 mg/dL (ref 8.7–10.3)
Chloride: 100 mmol/L (ref 96–106)
Creatinine, Ser: 0.87 mg/dL (ref 0.57–1.00)
Globulin, Total: 2 g/dL (ref 1.5–4.5)
Glucose: 106 mg/dL — ABNORMAL HIGH (ref 70–99)
Potassium: 4.3 mmol/L (ref 3.5–5.2)
Sodium: 139 mmol/L (ref 134–144)
Total Protein: 6.8 g/dL (ref 6.0–8.5)
eGFR: 72 mL/min/{1.73_m2} (ref >59)

## 2022-08-11 LAB — CBC WITH DIFFERENTIAL/PLATELET
Basophils Absolute: 0.1 10*3/uL (ref 0.0–0.2)
Basos: 1 %
EOS (ABSOLUTE): 0.4 10*3/uL (ref 0.0–0.4)
Eos: 3 %
Hematocrit: 47 % — ABNORMAL HIGH (ref 34.0–46.6)
Hemoglobin: 15.4 g/dL (ref 11.1–15.9)
Immature Grans (Abs): 0.1 10*3/uL (ref 0.0–0.1)
Immature Granulocytes: 1 %
Lymphocytes Absolute: 2.7 10*3/uL (ref 0.7–3.1)
Lymphs: 26 %
MCH: 29.1 pg (ref 26.6–33.0)
MCHC: 32.8 g/dL (ref 31.5–35.7)
MCV: 89 fL (ref 79–97)
Monocytes Absolute: 0.6 10*3/uL (ref 0.1–0.9)
Monocytes: 6 %
Neutrophils Absolute: 6.7 10*3/uL (ref 1.4–7.0)
Neutrophils: 63 %
Platelets: 463 10*3/uL — ABNORMAL HIGH (ref 150–450)
RBC: 5.3 x10E6/uL — ABNORMAL HIGH (ref 3.77–5.28)
RDW: 13.2 % (ref 11.7–15.4)
WBC: 10.4 10*3/uL (ref 3.4–10.8)

## 2022-08-11 LAB — LIPID PANEL
Chol/HDL Ratio: 4.6 ratio — ABNORMAL HIGH (ref 0.0–4.4)
Cholesterol, Total: 164 mg/dL (ref 100–199)
HDL: 36 mg/dL — ABNORMAL LOW (ref >39)
LDL Chol Calc (NIH): 91 mg/dL (ref 0–99)
Triglycerides: 215 mg/dL — ABNORMAL HIGH (ref 0–149)
VLDL Cholesterol Cal: 37 mg/dL (ref 5–40)

## 2022-08-11 LAB — HEMOGLOBIN A1C
Est. average glucose Bld gHb Est-mCnc: 126 mg/dL
Hgb A1c MFr Bld: 6 % — ABNORMAL HIGH (ref 4.8–5.6)

## 2022-08-11 LAB — TSH: TSH: 1.27 u[IU]/mL (ref 0.450–4.500)

## 2022-08-11 LAB — MICROALBUMIN / CREATININE URINE RATIO
Creatinine, Urine: 65.9 mg/dL
Microalb/Creat Ratio: 6 mg/g creat (ref 0–29)
Microalbumin, Urine: 4 ug/mL

## 2022-09-11 ENCOUNTER — Other Ambulatory Visit: Payer: Self-pay | Admitting: Internal Medicine

## 2022-09-11 DIAGNOSIS — E1169 Type 2 diabetes mellitus with other specified complication: Secondary | ICD-10-CM

## 2022-09-23 ENCOUNTER — Other Ambulatory Visit: Payer: Self-pay | Admitting: Internal Medicine

## 2022-09-23 DIAGNOSIS — I10 Essential (primary) hypertension: Secondary | ICD-10-CM

## 2022-10-02 DIAGNOSIS — Z79899 Other long term (current) drug therapy: Secondary | ICD-10-CM | POA: Diagnosis not present

## 2022-10-02 DIAGNOSIS — L4050 Arthropathic psoriasis, unspecified: Secondary | ICD-10-CM | POA: Diagnosis not present

## 2022-10-02 DIAGNOSIS — L409 Psoriasis, unspecified: Secondary | ICD-10-CM | POA: Diagnosis not present

## 2022-10-09 ENCOUNTER — Other Ambulatory Visit: Payer: Self-pay | Admitting: Internal Medicine

## 2022-10-09 DIAGNOSIS — G2581 Restless legs syndrome: Secondary | ICD-10-CM

## 2022-10-26 ENCOUNTER — Telehealth: Payer: Self-pay | Admitting: Internal Medicine

## 2022-10-26 NOTE — Telephone Encounter (Signed)
Copied from CRM [id number]. Topic: Medicare AWV >> Oct 26, 2022 11:29 AM Payton Doughty wrote: Reason for CRM: Called patient to schedule Medicare Annual Wellness Visit (AWV). Left message for patient to call back and schedule Medicare Annual Wellness Visit (AWV).  Last date of AWV: 11/14/21  Please schedule an appointment at any time with Kennedy Bucker, LPN  .  If any questions, please contact me.  Thank you ,  Verlee Rossetti; Care Guide Ambulatory Clinical Support [place] [facility] Direct Dial: [phone]

## 2022-11-11 ENCOUNTER — Other Ambulatory Visit: Payer: Self-pay | Admitting: Internal Medicine

## 2022-11-11 DIAGNOSIS — G2581 Restless legs syndrome: Secondary | ICD-10-CM

## 2022-11-13 NOTE — Telephone Encounter (Signed)
Requested medication (s) are due for refill today: yes  Requested medication (s) are on the active medication list: yes  Last refill:  10/09/22 #30/0  Future visit scheduled: no  Notes to clinic:  Unable to refill per protocol, cannot delegate.    Requested Prescriptions  Pending Prescriptions Disp Refills   cyclobenzaprine (FLEXERIL) 10 MG tablet [Pharmacy Med Name: CYCLOBENZAPRINE 10 MG TABLET] 30 tablet 0    Sig: TAKE 1 TABLET (10 MG TOTAL) BY MOUTH AT BEDTIME. RESTLESS LEG- USUALLY AT BEDTIME.     Not Delegated - Analgesics:  Muscle Relaxants Failed - 11/11/2022  8:42 AM      Failed - This refill cannot be delegated      Passed - Valid encounter within last 6 months    Recent Outpatient Visits           3 months ago Annual physical exam   [place] [facility] at [facility], Nyoka Cowden, MD   11 months ago Essential hypertension   [place] [facility] at [facility], Nyoka Cowden, MD   1 year ago Annual physical exam   [facility] at [facility], Nyoka Cowden, MD   1 year ago Type II diabetes mellitus with complication [facility]   [place] [facility] at [facility], Nyoka Cowden, MD   1 year ago Type II diabetes mellitus with complication [facility])   [place] [facility] at [facility], Nyoka Cowden, MD       Future Appointments             In 9 months Deirdre Evener, MD [facility]

## 2023-01-12 ENCOUNTER — Other Ambulatory Visit: Payer: Self-pay | Admitting: Internal Medicine

## 2023-01-12 DIAGNOSIS — E1169 Type 2 diabetes mellitus with other specified complication: Secondary | ICD-10-CM

## 2023-01-12 DIAGNOSIS — E118 Type 2 diabetes mellitus with unspecified complications: Secondary | ICD-10-CM

## 2023-01-12 NOTE — Telephone Encounter (Signed)
Requested Prescriptions  Pending Prescriptions Disp Refills   atorvastatin (LIPITOR) 80 MG tablet [Pharmacy Med Name: ATORVASTATIN 80 MG TABLET] 90 tablet 0    Sig: TAKE 1 TABLET BY MOUTH EVERY DAY     Cardiovascular:  Antilipid - Statins Failed - 01/12/2023  2:48 AM      Failed - Lipid Panel in normal range within the last 12 months    Cholesterol, Total  Date Value Ref Range Status  08/09/2022 164 100 - 199 mg/dL Final   LDL Chol Calc (NIH)  Date Value Ref Range Status  08/09/2022 91 0 - 99 mg/dL Final   HDL  Date Value Ref Range Status  08/09/2022 36 (L) >39 mg/dL Final   Triglycerides  Date Value Ref Range Status  08/09/2022 215 (H) 0 - 149 mg/dL Final         Passed - Patient is not pregnant      Passed - Valid encounter within last 12 months    Recent Outpatient Visits           5 months ago Annual physical exam   [place] [facility] at [facility], Nyoka Cowden, MD   1 year ago Essential hypertension   [place] [facility] at [facility], Nyoka Cowden, MD   1 year ago Annual physical exam   [place] [facility] at [facility], Nyoka Cowden, MD   1 year ago Type II diabetes mellitus with complication [facility])   [place] [facility] at [facility], Nyoka Cowden, MD   2 years ago Type II diabetes mellitus with complication [facility])   [place] [facility] at [facility], Nyoka Cowden, MD       Future Appointments             In 7 months Deirdre Evener, MD  [facility]             dapagliflozin propanediol (FARXIGA) 10 MG TABS tablet [Pharmacy Med Name: FARXIGA 10 MG TABLET] 90 tablet 0    Sig: TAKE 1 TABLET BY MOUTH EVERY DAY     Endocrinology:  Diabetes - SGLT2 Inhibitors Passed - 01/12/2023  2:48 AM      Passed - Cr in normal range and within 360 days    Creatinine, Ser  Date Value  Ref Range Status  08/09/2022 0.87 0.57 - 1.00 mg/dL Final         Passed - HBA1C is between 0 and 7.9 and within 180 days    Hemoglobin A1C  Date Value Ref Range Status  08/13/2018 6.4  Final   Hgb A1c MFr Bld  Date Value Ref Range Status  08/09/2022 6.0 (H) 4.8 - 5.6 % Final    Comment:             Prediabetes: 5.7 - 6.4          Diabetes: >6.4          Glycemic control for adults with diabetes: <7.0          Passed - eGFR in normal range and within 360 days    GFR calc Af Amer  Date Value Ref Range Status  08/06/2020 106 >59 mL/min/1.73 Final    Comment:    **In accordance with recommendations from the NKF-ASN Task force,**   [facility] is in the process of updating its eGFR  calculation to the   2021 CKD-EPI creatinine equation that estimates kidney function   without a race variable.    GFR calc non Af Amer  Date Value Ref Range Status  08/06/2020 92 >59 mL/min/1.73 Final   eGFR  Date Value Ref Range Status  08/09/2022 72 >59 mL/min/1.73 Final         Passed - Valid encounter within last 6 months    Recent Outpatient Visits           5 months ago Annual physical exam   [place] [facility] at [facility], Nyoka Cowden, MD   1 year ago Essential hypertension   [place] [facility] at [facility], Nyoka Cowden, MD   1 year ago Annual physical exam   [facility] at [facility], Nyoka Cowden, MD   1 year ago Type II diabetes mellitus with complication [facility])   [place] [facility] at [facility], Nyoka Cowden, MD   2 years ago Type II diabetes mellitus with complication [facility])    Primary [facility] & [facility] Medicine at [facility], Nyoka Cowden, MD       Future Appointments             In 7 months Deirdre Evener, MD [facility]

## 2023-01-14 ENCOUNTER — Other Ambulatory Visit: Payer: Self-pay | Admitting: Internal Medicine

## 2023-01-14 DIAGNOSIS — G2581 Restless legs syndrome: Secondary | ICD-10-CM

## 2023-01-15 NOTE — Telephone Encounter (Signed)
Requested medication (s) are due for refill today: yes  Requested medication (s) are on the active medication list: yes  Last refill:  11/14/22  Future visit scheduled: no  Notes to clinic:  Unable to refill per protocol, cannot delegate.      Requested Prescriptions  Pending Prescriptions Disp Refills   cyclobenzaprine (FLEXERIL) 10 MG tablet [Pharmacy Med Name: CYCLOBENZAPRINE 10 MG TABLET] 30 tablet 1    Sig: TAKE 1 TABLET (10 MG TOTAL) BY MOUTH AT BEDTIME. RESTLESS LEG- USUALLY AT BEDTIME.     Not Delegated - Analgesics:  Muscle Relaxants Failed - 01/14/2023  9:17 AM      Failed - This refill cannot be delegated      Passed - Valid encounter within last 6 months    Recent Outpatient Visits           5 months ago Annual physical exam   [place] [facility] at [facility], Nyoka Cowden, MD   1 year ago Essential hypertension   [place] [facility] at [facility], Nyoka Cowden, MD   1 year ago Annual physical exam   [facility] at [facility], Nyoka Cowden, MD   1 year ago Type II diabetes mellitus with complication [facility])   [place] [facility] at [facility], Nyoka Cowden, MD   2 years ago Type II diabetes mellitus with complication [facility])    Primary Care & Sports Medicine at [facility], Nyoka Cowden, MD       Future Appointments             In 7 months Deirdre Evener, MD [facility]

## 2023-01-18 ENCOUNTER — Telehealth: Payer: Self-pay

## 2023-01-18 ENCOUNTER — Other Ambulatory Visit: Payer: Self-pay | Admitting: Internal Medicine

## 2023-01-18 DIAGNOSIS — H8113 Benign paroxysmal vertigo, bilateral: Secondary | ICD-10-CM

## 2023-01-18 NOTE — Telephone Encounter (Signed)
Noted  KP 

## 2023-01-18 NOTE — Telephone Encounter (Signed)
Copied from CRM [id number]. Topic: General - Other >> Jan 18, 2023  9:41 AM Phill Myron wrote: Ms Sisto stated she is now in [place] and has not found a primary care provider. She also stated she will not be traveling to [facility] for a doctors visit.

## 2023-08-22 ENCOUNTER — Ambulatory Visit: Payer: Medicare HMO | Admitting: Dermatology
